# Patient Record
Sex: Male | Born: 1995 | Race: White | Hispanic: No | Marital: Single | Smoking: Current every day smoker
Health system: Southern US, Community
[De-identification: ages and names within clinical notes are randomized; demographics above are authoritative.]

## PROBLEM LIST (undated history)

## (undated) DIAGNOSIS — J45909 Unspecified asthma, uncomplicated: Secondary | ICD-10-CM

## (undated) DIAGNOSIS — B582 Toxoplasma meningoencephalitis: Secondary | ICD-10-CM

## (undated) DIAGNOSIS — B20 Human immunodeficiency virus [HIV] disease: Secondary | ICD-10-CM

## (undated) DIAGNOSIS — Z21 Asymptomatic human immunodeficiency virus [HIV] infection status: Secondary | ICD-10-CM

---
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
## 2016-07-02 ENCOUNTER — Emergency Department
Admission: EM | Admit: 2016-07-02 | Discharge: 2016-07-02 | Disposition: A | Discharge status: Home / Self Care | Payer: Medicaid Other | Attending: Emergency Medicine | Admitting: Emergency Medicine

## 2016-07-02 DIAGNOSIS — R197 Diarrhea, unspecified: Secondary | ICD-10-CM | POA: Diagnosis not present

## 2016-07-02 DIAGNOSIS — R112 Nausea with vomiting, unspecified: Secondary | ICD-10-CM

## 2016-07-02 DIAGNOSIS — J45909 Unspecified asthma, uncomplicated: Secondary | ICD-10-CM | POA: Insufficient documentation

## 2016-07-02 DIAGNOSIS — F17210 Nicotine dependence, cigarettes, uncomplicated: Secondary | ICD-10-CM | POA: Diagnosis not present

## 2016-07-02 DIAGNOSIS — R509 Fever, unspecified: Secondary | ICD-10-CM

## 2016-07-02 LAB — LIPASE, BLOOD: Lipase: 22 U/L (ref 11–51)

## 2016-07-02 LAB — CBC
HCT: 48.8 % (ref 40.0–52.0)
Hemoglobin: 16.8 g/dL (ref 13.0–18.0)
MCH: 31.5 pg (ref 26.0–34.0)
MCHC: 34.4 g/dL (ref 32.0–36.0)
MCV: 91.7 fL (ref 80.0–100.0)
Platelets: 89 10*3/uL — ABNORMAL LOW (ref 150–440)
RBC: 5.32 MIL/uL (ref 4.40–5.90)
RDW: 13.2 % (ref 11.5–14.5)
WBC: 5.4 10*3/uL (ref 3.8–10.6)

## 2016-07-02 LAB — COMPREHENSIVE METABOLIC PANEL
ALT: 24 U/L (ref 17–63)
AST: 28 U/L (ref 15–41)
Albumin: 3.7 g/dL (ref 3.5–5.0)
Alkaline Phosphatase: 51 U/L (ref 38–126)
Anion gap: 8 (ref 5–15)
BUN: 11 mg/dL (ref 6–20)
CO2: 21 mmol/L — ABNORMAL LOW (ref 22–32)
Calcium: 8.2 mg/dL — ABNORMAL LOW (ref 8.9–10.3)
Chloride: 107 mmol/L (ref 101–111)
Creatinine, Ser: 0.95 mg/dL (ref 0.61–1.24)
GFR calc Af Amer: >60 mL/min (ref >60)
GFR calc non Af Amer: >60 mL/min (ref >60)
Glucose, Bld: 109 mg/dL — ABNORMAL HIGH (ref 65–99)
Potassium: 3.4 mmol/L — ABNORMAL LOW (ref 3.5–5.1)
Sodium: 136 mmol/L (ref 135–145)
Total Bilirubin: 0.6 mg/dL (ref 0.3–1.2)
Total Protein: 6.2 g/dL — ABNORMAL LOW (ref 6.5–8.1)

## 2016-07-02 LAB — URINALYSIS, COMPLETE (UACMP) WITH MICROSCOPIC
Bacteria, UA: NONE SEEN
Bilirubin Urine: NEGATIVE
Glucose, UA: NEGATIVE mg/dL
Hgb urine dipstick: NEGATIVE
Ketones, ur: 5 mg/dL — AB
Leukocytes, UA: NEGATIVE
Nitrite: NEGATIVE
Protein, ur: NEGATIVE mg/dL
Specific Gravity, Urine: 1.019 (ref 1.005–1.030)
pH: 5 (ref 5.0–8.0)

## 2016-07-02 LAB — INFLUENZA PANEL BY PCR (TYPE A & B)
Influenza A By PCR: NEGATIVE
Influenza B By PCR: NEGATIVE

## 2016-07-02 MED ORDER — METOCLOPRAMIDE HCL 5 MG/ML IJ SOLN
10.0000 mg | Freq: Once | INTRAMUSCULAR | Status: AC
  Administered 2016-07-02: 10 mg via INTRAVENOUS
  Filled 2016-07-02: qty 2

## 2016-07-02 MED ORDER — ONDANSETRON 4 MG PO TBDP
4.0000 mg | ORAL_TABLET | Freq: Once | ORAL | Status: AC | PRN
  Administered 2016-07-02: 4 mg via ORAL

## 2016-07-02 MED ORDER — DIPHENHYDRAMINE HCL 50 MG/ML IJ SOLN
25.0000 mg | Freq: Once | INTRAMUSCULAR | Status: AC
  Administered 2016-07-02: 25 mg via INTRAVENOUS
  Filled 2016-07-02: qty 1

## 2016-07-02 MED ORDER — SODIUM CHLORIDE 0.9 % IV BOLUS (SEPSIS)
1000.0000 mL | Freq: Once | INTRAVENOUS | Status: AC
  Administered 2016-07-02: 1000 mL via INTRAVENOUS

## 2016-07-02 MED ORDER — KETOROLAC TROMETHAMINE 30 MG/ML IJ SOLN
30.0000 mg | Freq: Once | INTRAMUSCULAR | Status: AC
  Administered 2016-07-02: 30 mg via INTRAVENOUS
  Filled 2016-07-02: qty 1

## 2016-07-02 NOTE — Discharge Instructions (Signed)
[facility]

## 2016-07-02 NOTE — ED Provider Notes (Signed)
[facility] Emergency Department Provider Note   ____________________________________________   First MD Initiated Contact with Patient 07/02/16 [phone]     (approximate)  I have reviewed the triage vital signs and the nursing notes.   HISTORY  Chief Complaint Emesis; Diarrhea; and Migraine    HPI Derrick Leonard is a 21 y.o. male who comes into the hospital today with a temperature to 103. He reports that it was earlier today. He was also vomiting and has a migraine headache. He reports he had a cold a few days ago and lots of diarrhea. The patient has been taking ibuprofen. He vomited 4 times before coming in. He states his dark looking brown and red. The patient's diarrhea has also been watery and brown. The patient endorses some abdominal pain all over his abdomen. He reports it is crampy. The patient has also been feeling weak. He rates his pain at 8 out of 10 in intensity. He denies any sore throat or sick contacts. The patient donated plasma today.   Past Medical History:  Diagnosis Date  . Asthma     Patient Active Problem List   Diagnosis Date Noted  . Appendicitis, acute     Past Surgical History:  Procedure Laterality Date  . LAPAROSCOPIC APPENDECTOMY N/A 06/30/2015   Procedure: APPENDECTOMY LAPAROSCOPIC;  Surgeon: Leafy Ro, MD;  Location: [facility] ORS;  Service: General;  Laterality: N/A;    Prior to Admission medications   Medication Sig Start Date End Date Taking? Authorizing Provider  HYDROcodone-acetaminophen (NORCO/VICODIN) 5-325 MG tablet Take 1-2 tablets by mouth every 4 (four) hours as needed for moderate pain. Patient not taking: Reported on 07/02/2016 07/01/15   Sterling Big F, MD  oxyCODONE-acetaminophen (ROXICET) 5-325 MG tablet Take 1 tablet by mouth every 4 (four) hours as needed for severe pain. Patient not taking: Reported on 07/02/2016 07/05/15   Irean Hong, MD    Allergies Patient has no known allergies.  No family  history on file.  Social History Social History  Substance Use Topics  . Smoking status: Current Every Day Smoker    Packs/day: 1.00    Types: Cigarettes  . Smokeless tobacco: Never Used  . Alcohol use Yes     Comment: occ    Review of Systems  Constitutional: fever/chills Eyes: No visual changes. ENT: No sore throat. Cardiovascular: Denies chest pain. Respiratory: Denies shortness of breath. Gastrointestinal:  abdominal pain.   nausea,  vomiting.   diarrhea.  No constipation. Genitourinary: Negative for dysuria. Musculoskeletal: Negative for back pain. Skin: Negative for rash. Neurological: Headache.   ____________________________________________   PHYSICAL EXAM:  VITAL SIGNS: ED Triage Vitals  Enc Vitals Group     BP 07/02/16 0241 107/61     Pulse Rate 07/02/16 0241 99     Resp 07/02/16 0241 18     Temp 07/02/16 0241 (!) 101 F (38.3 C)     Temp Source 07/02/16 0241 Oral     SpO2 07/02/16 0241 96 %     Weight 07/02/16 0243 130 lb (59 kg)     Height 07/02/16 0243 5\' 9"  (1.753 m)     Head Circumference --      Peak Flow --      Pain Score 07/02/16 0556 10     Pain Loc --      Pain Edu? --      Excl. in GC? --     Constitutional: Alert and oriented. Well appearing and in no  acute distress. Eyes: Conjunctivae are normal. PERRL. EOMI. Head: Atraumatic. Nose: No congestion/rhinnorhea. Mouth/Throat: Mucous membranes are moist.  Oropharynx non-erythematous. Cardiovascular: Normal rate, regular rhythm. Grossly normal heart sounds.  Good peripheral circulation. Respiratory: Normal respiratory effort.  No retractions. Lungs CTAB. Gastrointestinal: Soft and nontender. No distention. Positive bowel sounds Musculoskeletal: No lower extremity tenderness nor edema.   Neurologic:  Normal speech and language.  Skin:  Skin is warm, dry and intact. Psychiatric: Mood and affect are normal.   ____________________________________________   LABS (all labs ordered are  listed, but only abnormal results are displayed)  Labs Reviewed  COMPREHENSIVE METABOLIC PANEL - Abnormal; Notable for the following:       Result Value   Potassium 3.4 (*)    CO2 21 (*)    Glucose, Bld 109 (*)    Calcium 8.2 (*)    Total Protein 6.2 (*)    All other components within normal limits  CBC - Abnormal; Notable for the following:    Platelets 89 (*)    All other components within normal limits  URINALYSIS, COMPLETE (UACMP) WITH MICROSCOPIC - Abnormal; Notable for the following:    Color, Urine YELLOW (*)    APPearance CLOUDY (*)    Ketones, ur 5 (*)    Squamous Epithelial / LPF 0-5 (*)    All other components within normal limits  LIPASE, BLOOD  INFLUENZA PANEL BY PCR (TYPE A & B)   ____________________________________________  EKG  none ____________________________________________  RADIOLOGY  No results found.  ____________________________________________   PROCEDURES  Procedure(s) performed: None  Procedures  Critical Care performed: No  ____________________________________________   INITIAL IMPRESSION / ASSESSMENT AND PLAN / ED COURSE  Pertinent labs & imaging results that were available during my care of the patient were reviewed by me and considered in my medical decision making (see chart for details).  This is a 21 year old male who comes into the hospital today with some fever and abdominal pain. He's also had some body aches and headache as well as some cold symptoms. The patient's blood work is unremarkable. I gave the patient some Reglan, Benadryl and Toradol liter of normal saline. I ordered a flu as there is still flu going around. The patient reports though that he is ready to go home. He reports that he doesn't want to stay anymore he feels improved. He'll be discharged to home.      ____________________________________________   FINAL CLINICAL IMPRESSION(S) / ED DIAGNOSES  Final diagnoses:  Nausea vomiting and diarrhea    Fever, unspecified fever cause      NEW MEDICATIONS STARTED DURING THIS VISIT:  Discharge Medication List as of 07/02/2016  6:56 AM       Note:  This document was prepared using Dragon voice recognition software and may include unintentional dictation errors.    Rebecka ApleyWebster, Derrick Leonard P, MD 07/02/16 [phone]

## 2016-07-02 NOTE — ED Triage Notes (Signed)
Pt reports having N/V/D x1 week.  Pt states he has only been able to keep water down for the past week.  Pt states that this started after donating plasma a week ago.  Pt reports having a fever as high as 103 at home.  Pt reports last dose of ibuprofen was at 4pm yesterday.  Pt A&Ox4 and ambulatory to triage.

## 2016-07-02 NOTE — ED Notes (Addendum)
Pt kicking in bed moaning, when asked by this RN "what was wrong?" pt stated "I wanna go home." Pt educated on leaving AMA, MD notified. Pt verbalized understanding of leaving AMA and reports still wants to go home. IV removed. MD and charge RN notified of patient departure. Pt alert and oriented, ambulated to lobby with steady gait accompanied by friend.

## 2016-07-04 ENCOUNTER — Emergency Department
Admission: EM | Admit: 2016-07-04 | Discharge: 2016-07-05 | Disposition: A | Discharge status: Home / Self Care | Payer: Medicaid Other | Attending: Emergency Medicine | Admitting: Emergency Medicine

## 2016-07-04 DIAGNOSIS — J45909 Unspecified asthma, uncomplicated: Secondary | ICD-10-CM | POA: Diagnosis not present

## 2016-07-04 DIAGNOSIS — J029 Acute pharyngitis, unspecified: Secondary | ICD-10-CM

## 2016-07-04 DIAGNOSIS — F17210 Nicotine dependence, cigarettes, uncomplicated: Secondary | ICD-10-CM | POA: Insufficient documentation

## 2016-07-04 LAB — POCT RAPID STREP A: Streptococcus, Group A Screen (Direct): NEGATIVE

## 2016-07-04 NOTE — ED Triage Notes (Signed)
Patient reports sore throat for several days.

## 2016-07-05 NOTE — ED Notes (Signed)
Pt seen by MD.

## 2016-07-05 NOTE — ED Provider Notes (Signed)
[facility] Emergency Department Provider Note   ____________________________________________    I have reviewed the triage vital signs and the nursing notes.   HISTORY  Chief Complaint Sore Throat     HPI Derrick Leonard is a 21 y.o. male who presents with complaints of mild sore throat. Patient reports his throat has been mildly sore for the last 2 days ever since he stopped vomiting, he apparently had viral gastroenteritis. No fevers or chills. No difficulty swallowing. No difficulty breathing.   Past Medical History:  Diagnosis Date  . Asthma     Patient Active Problem List   Diagnosis Date Noted  . Appendicitis, acute     Past Surgical History:  Procedure Laterality Date  . LAPAROSCOPIC APPENDECTOMY N/A 06/30/2015   Procedure: APPENDECTOMY LAPAROSCOPIC;  Surgeon: Leafy Roiego F Pabon, MD;  Location: [facility] ORS;  Service: General;  Laterality: N/A;    Prior to Admission medications   Medication Sig Start Date End Date Taking? Authorizing Provider  HYDROcodone-acetaminophen (NORCO/VICODIN) 5-325 MG tablet Take 1-2 tablets by mouth every 4 (four) hours as needed for moderate pain. Patient not taking: Reported on 07/02/2016 07/01/15   Sterling BigPabon, Diego F, MD  oxyCODONE-acetaminophen (ROXICET) 5-325 MG tablet Take 1 tablet by mouth every 4 (four) hours as needed for severe pain. Patient not taking: Reported on 07/02/2016 07/05/15   Irean HongSung, Jade J, MD     Allergies Patient has no known allergies.  No family history on file.  Social History Social History  Substance Use Topics  . Smoking status: Current Every Day Smoker    Packs/day: 1.00    Types: Cigarettes  . Smokeless tobacco: Never Used  . Alcohol use Yes     Comment: occ    Review of Systems  Constitutional: No fever/chills  ENT: As above   Gastrointestinal: No abdominal pain.  No nausea, no vomiting.    Skin: Negative for rash. Neurological: Negative for headaches      ____________________________________________   PHYSICAL EXAM:  VITAL SIGNS: ED Triage Vitals [07/04/16 2311]  Enc Vitals Group     BP 116/65     Pulse Rate 76     Resp 20     Temp 98.8 F (37.1 C)     Temp Source Oral     SpO2 99 %     Weight      Height      Head Circumference      Peak Flow      Pain Score      Pain Loc      Pain Edu?      Excl. in GC?     Constitutional: Alert and oriented. No acute distress. Pleasant and interactive Eyes: Conjunctivae are normal.   Nose: No congestion/rhinnorhea. Mouth/Throat: Mucous membranes are moist.  Pharynx normal Cardiovascular: Normal rate, regular rhythm.  Respiratory: Normal respiratory effort.  No retractions. Genitourinary: deferred Musculoskeletal: No lower extremity tenderness nor edema.   Neurologic:  Normal speech and language. No gross focal neurologic deficits are appreciated.   Skin:  Skin is warm, dry and intact. No rash noted.   ____________________________________________   LABS (all labs ordered are listed, but only abnormal results are displayed)  Labs Reviewed  POCT RAPID STREP A   ____________________________________________  EKG   ____________________________________________  RADIOLOGY  None ____________________________________________   PROCEDURES  Procedure(s) performed: No    Critical Care performed: No ____________________________________________   INITIAL IMPRESSION / ASSESSMENT AND PLAN / ED COURSE  Pertinent  labs & imaging results that were available during my care of the patient were reviewed by me and considered in my medical decision making (see chart for details).  Patient well-appearing no acute distress. Exam is normal. Strep is negative. Suspect chemical irritation from vomiting. Recommend supportive care.   ____________________________________________   FINAL CLINICAL IMPRESSION(S) / ED DIAGNOSES  Final diagnoses:  Pharyngitis, unspecified etiology       NEW MEDICATIONS STARTED DURING THIS VISIT:  New Prescriptions   No medications on file     Note:  This document was prepared using Dragon voice recognition software and may include unintentional dictation errors.    Jene Every, MD 07/05/16 [phone]

## 2016-07-08 ENCOUNTER — Emergency Department
Admission: EM | Admit: 2016-07-08 | Discharge: 2016-07-08 | Disposition: A | Discharge status: Home / Self Care | Payer: Medicaid Other | Attending: Emergency Medicine | Admitting: Emergency Medicine

## 2016-07-08 ENCOUNTER — Encounter: Payer: Self-pay | Admitting: Emergency Medicine

## 2016-07-08 DIAGNOSIS — F17210 Nicotine dependence, cigarettes, uncomplicated: Secondary | ICD-10-CM | POA: Diagnosis not present

## 2016-07-08 DIAGNOSIS — R112 Nausea with vomiting, unspecified: Secondary | ICD-10-CM | POA: Insufficient documentation

## 2016-07-08 DIAGNOSIS — R1110 Vomiting, unspecified: Secondary | ICD-10-CM

## 2016-07-08 DIAGNOSIS — E860 Dehydration: Secondary | ICD-10-CM | POA: Diagnosis not present

## 2016-07-08 DIAGNOSIS — J45909 Unspecified asthma, uncomplicated: Secondary | ICD-10-CM | POA: Diagnosis not present

## 2016-07-08 LAB — URINALYSIS, COMPLETE (UACMP) WITH MICROSCOPIC
Bacteria, UA: NONE SEEN
Bilirubin Urine: NEGATIVE
Glucose, UA: NEGATIVE mg/dL
Hgb urine dipstick: NEGATIVE
Ketones, ur: 20 mg/dL — AB
Leukocytes, UA: NEGATIVE
Nitrite: NEGATIVE
Protein, ur: NEGATIVE mg/dL
RBC / HPF: NONE SEEN RBC/hpf (ref 0–5)
Specific Gravity, Urine: 1.028 (ref 1.005–1.030)
WBC, UA: NONE SEEN WBC/hpf (ref 0–5)
pH: 5 (ref 5.0–8.0)

## 2016-07-08 LAB — COMPREHENSIVE METABOLIC PANEL
ALT: 45 U/L (ref 17–63)
AST: 34 U/L (ref 15–41)
Albumin: 4.2 g/dL (ref 3.5–5.0)
Alkaline Phosphatase: 56 U/L (ref 38–126)
Anion gap: 8 (ref 5–15)
BUN: 13 mg/dL (ref 6–20)
CO2: 26 mmol/L (ref 22–32)
Calcium: 8.8 mg/dL — ABNORMAL LOW (ref 8.9–10.3)
Chloride: 102 mmol/L (ref 101–111)
Creatinine, Ser: 0.84 mg/dL (ref 0.61–1.24)
GFR calc Af Amer: >60 mL/min (ref >60)
GFR calc non Af Amer: >60 mL/min (ref >60)
Glucose, Bld: 127 mg/dL — ABNORMAL HIGH (ref 65–99)
Potassium: 3.5 mmol/L (ref 3.5–5.1)
Sodium: 136 mmol/L (ref 135–145)
Total Bilirubin: 0.7 mg/dL (ref 0.3–1.2)
Total Protein: 7.2 g/dL (ref 6.5–8.1)

## 2016-07-08 LAB — CBC
HCT: 50.3 % (ref 40.0–52.0)
Hemoglobin: 17.4 g/dL (ref 13.0–18.0)
MCH: 31 pg (ref 26.0–34.0)
MCHC: 34.7 g/dL (ref 32.0–36.0)
MCV: 89.4 fL (ref 80.0–100.0)
Platelets: 101 10*3/uL — ABNORMAL LOW (ref 150–440)
RBC: 5.62 MIL/uL (ref 4.40–5.90)
RDW: 12.9 % (ref 11.5–14.5)
WBC: 3.7 10*3/uL — ABNORMAL LOW (ref 3.8–10.6)

## 2016-07-08 LAB — URINE DRUG SCREEN, QUALITATIVE (ARMC ONLY)
Amphetamines, Ur Screen: NOT DETECTED
Barbiturates, Ur Screen: NOT DETECTED
Benzodiazepine, Ur Scrn: NOT DETECTED
Cannabinoid 50 Ng, Ur ~~LOC~~: POSITIVE — AB
Cocaine Metabolite,Ur ~~LOC~~: NOT DETECTED
MDMA (Ecstasy)Ur Screen: NOT DETECTED
Methadone Scn, Ur: NOT DETECTED
Opiate, Ur Screen: NOT DETECTED
Phencyclidine (PCP) Ur S: NOT DETECTED
Tricyclic, Ur Screen: NOT DETECTED

## 2016-07-08 LAB — LIPASE, BLOOD: Lipase: 26 U/L (ref 11–51)

## 2016-07-08 MED ORDER — ONDANSETRON 4 MG PO TBDP
8.0000 mg | ORAL_TABLET | Freq: Once | ORAL | Status: AC
  Administered 2016-07-08: 8 mg via ORAL
  Filled 2016-07-08: qty 2

## 2016-07-08 MED ORDER — ONDANSETRON 4 MG PO TBDP: 4.0000 mg | ORAL_TABLET | Freq: Three times a day (TID) | ORAL | 0 refills | Status: DC | PRN

## 2016-07-08 NOTE — ED Triage Notes (Signed)
Pt reports continued vomiting. Pt seen here 07/02/16 and 07/04/16 for vomiting and then sore throat. Pt then was seen by an urgent care that put him on amoxicillin. Pt states he is vomiting 3-4 times a day. Pt denies pain.

## 2016-07-08 NOTE — ED Provider Notes (Addendum)
[facility] Emergency Department Provider Note  ____________________________________________   First MD Initiated Contact with Patient 07/08/16 1716     (approximate)  I have reviewed the triage vital signs and the nursing notes.   HISTORY  Chief Complaint Emesis    HPI Derrick Leonard is a 21 y.o. male who comes to the emergency department with 2 days of nausea vomiting and abdominal discomfort. His symptoms all began 4 days ago when he went to an urgent care and was diagnosed with presumptive strep pharyngitis. He was given a prescription for amoxicillin which she had never had before. The following day he had some nausea and headaches and he came to our emergency department and was evaluated and discharged. At that time he had a negative strep test. He continued to take the amoxicillin until last night. Today he called urgent care and they said that his nausea may be secondary to the amoxicillin did to stop it. He has not been able to eat or keep water down which prompted the visit today. He has remote surgical history of appendectomy.  He has had no fevers no chills and no diarrhea. He has mild to moderate cramping lower abdominal pain that is improved after vomiting.   Past Medical History:  Diagnosis Date  . Asthma     Patient Active Problem List   Diagnosis Date Noted  . Appendicitis, acute     Past Surgical History:  Procedure Laterality Date  . LAPAROSCOPIC APPENDECTOMY N/A 06/30/2015   Procedure: APPENDECTOMY LAPAROSCOPIC;  Surgeon: Leafy Roiego F Pabon, MD;  Location: [facility] ORS;  Service: General;  Laterality: N/A;    Prior to Admission medications   Medication Sig Start Date End Date Taking? Authorizing Provider  HYDROcodone-acetaminophen (NORCO/VICODIN) 5-325 MG tablet Take 1-2 tablets by mouth every 4 (four) hours as needed for moderate pain. Patient not taking: Reported on 07/02/2016 07/01/15   Sterling BigPabon, Diego F, MD  oxyCODONE-acetaminophen  (ROXICET) 5-325 MG tablet Take 1 tablet by mouth every 4 (four) hours as needed for severe pain. Patient not taking: Reported on 07/02/2016 07/05/15   Irean HongSung, Jade J, MD    Allergies Patient has no known allergies.  No family history on file.  Social History Social History  Substance Use Topics  . Smoking status: Current Every Day Smoker    Packs/day: 1.00    Types: Cigarettes  . Smokeless tobacco: Never Used  . Alcohol use Yes     Comment: occ    Review of Systems Constitutional: No fever/chills Eyes: No visual changes. ENT: No sore throat. Cardiovascular: Denies chest pain. Respiratory: Denies shortness of breath. Gastrointestinal: Positive abdominal pain.  Positive nausea, positive vomiting.  No diarrhea.  No constipation. Genitourinary: Negative for dysuria. Musculoskeletal: Negative for back pain. Skin: Negative for rash. Neurological: Negative for headaches, focal weakness or numbness.   ____________________________________________   PHYSICAL EXAM:  VITAL SIGNS: ED Triage Vitals  Enc Vitals Group     BP 07/08/16 1631 132/85     Pulse Rate 07/08/16 1631 79     Resp 07/08/16 1631 20     Temp 07/08/16 1631 98.3 F (36.8 C)     Temp Source 07/08/16 1631 Oral     SpO2 07/08/16 1631 97 %     Weight 07/08/16 1632 135 lb (61.2 kg)     Height 07/08/16 1632 5\' 9"  (1.753 m)     Head Circumference --      Peak Flow --      Pain  Score 07/08/16 1637 0     Pain Loc --      Pain Edu? --      Excl. in GC? --     Constitutional: Alert and oriented 4 lying prone on his abdomen appears somewhat uncomfortable but no diaphoresis Eyes: PERRL EOMI. Head: Atraumatic. Nose: No congestion/rhinnorhea. Mouth/Throat: No trismus uvula midline no pharyngeal erythema or exudate Neck: No stridor.   Cardiovascular: Normal rate, regular rhythm. Grossly normal heart sounds.  Good peripheral circulation. Respiratory: Normal respiratory effort.  No retractions. Lungs CTAB and moving good  air Gastrointestinal: Soft nondistended nontender no rebound or guarding no peritonitis no McBurney's tenderness negative Rovsing's well-healed surgical scars Musculoskeletal: No lower extremity edema   Neurologic:  Normal speech and language. No gross focal neurologic deficits are appreciated. Skin:  Skin is warm, dry and intact. No rash noted. Psychiatric: Mood and affect are normal. Speech and behavior are normal.    _ ____________________________________________   LABS (all labs ordered are listed, but only abnormal results are displayed)  Labs Reviewed  COMPREHENSIVE METABOLIC PANEL - Abnormal; Notable for the following:       Result Value   Glucose, Bld 127 (*)    Calcium 8.8 (*)    All other components within normal limits  CBC - Abnormal; Notable for the following:    WBC 3.7 (*)    Platelets 101 (*)    All other components within normal limits  URINALYSIS, COMPLETE (UACMP) WITH MICROSCOPIC - Abnormal; Notable for the following:    Color, Urine YELLOW (*)    APPearance CLEAR (*)    Ketones, ur 20 (*)    Squamous Epithelial / LPF 0-5 (*)    Non Squamous Epithelial 0-5 (*)    All other components within normal limits  URINE DRUG SCREEN, QUALITATIVE ([facility] ONLY) - Abnormal; Notable for the following:    Cannabinoid 50 Ng, Ur [place] POSITIVE (*)    All other components within normal limits  LIPASE, BLOOD    Significant hemoconcentration suggestive of dehydration ketones in his urine also suggestive of dehydration __________________________________________  EKG   ____________________________________________  RADIOLOGY   ____________________________________________   PROCEDURES  Procedure(s) performed: no  Procedures  Critical Care performed: no  Observation: no ____________________________________________   INITIAL IMPRESSION / ASSESSMENT AND PLAN / ED COURSE  Pertinent labs & imaging results that were available during my care of the patient were  reviewed by me and considered in my medical decision making (see chart for details).  By the time I saw the patient he was already drinking ginger ale. He has a benign abdomen and artery has an appendectomy. He is somewhat hemoconcentrated likely secondary to dehydration. It is not clear whether or not his symptoms are related to amoxicillin versus viral syndrome. Regardless she is strep negative and does not need to continue antibiotics. I will discharge him home with a short course of Zofran and strict return precautions.      ____________________________________________   FINAL CLINICAL IMPRESSION(S) / ED DIAGNOSES  Final diagnoses:  Vomiting in pediatric patient  Dehydration      NEW MEDICATIONS STARTED DURING THIS VISIT:  New Prescriptions   No medications on file     Note:  This document was prepared using Dragon voice recognition software and may include unintentional dictation errors.     Merrily Brittle, MD 07/08/16 1753    Merrily Brittle, MD 07/08/16 [phone]

## 2016-07-08 NOTE — ED Notes (Signed)
Pt ambulatory to room with steady gait, emesis bag and ginger ale in hand. Here for continued vomiting. Alert and oriented.

## 2016-07-08 NOTE — Discharge Instructions (Signed)
Please make sure you remain well-hydrated and use your Zofran up to 3 times a day as needed for nausea. Return to the emergency department for any concerns.  It was a pleasure to take care of you today, and thank you for coming to our emergency department.  If you have any questions or concerns before leaving please ask the nurse to grab me and I'm more than happy to go through your aftercare instructions again.  If you were prescribed any opioid pain medication today such as Norco, Vicodin, Percocet, morphine, hydrocodone, or oxycodone please make sure you do not drive when you are taking this medication as it can alter your ability to drive safely.  If you have any concerns once you are home that you are not improving or are in fact getting worse before you can make it to your follow-up appointment, please do not hesitate to call 911 and come back for further evaluation.  Merrily BrittleNeil Delainey Winstanley MD  Results for orders placed or performed during the hospital encounter of 07/08/16  Lipase, blood  Result Value Ref Range   Lipase 26 11 - 51 U/L  Comprehensive metabolic panel  Result Value Ref Range   Sodium 136 135 - 145 mmol/L   Potassium 3.5 3.5 - 5.1 mmol/L   Chloride 102 101 - 111 mmol/L   CO2 26 22 - 32 mmol/L   Glucose, Bld 127 (H) 65 - 99 mg/dL   BUN 13 6 - 20 mg/dL   Creatinine, Ser 6.570.84 0.61 - 1.24 mg/dL   Calcium 8.8 (L) 8.9 - 10.3 mg/dL   Total Protein 7.2 6.5 - 8.1 g/dL   Albumin 4.2 3.5 - 5.0 g/dL   AST 34 15 - 41 U/L   ALT 45 17 - 63 U/L   Alkaline Phosphatase 56 38 - 126 U/L   Total Bilirubin 0.7 0.3 - 1.2 mg/dL   GFR calc non Af Amer >60 >60 mL/min   GFR calc Af Amer >60 >60 mL/min   Anion gap 8 5 - 15  CBC  Result Value Ref Range   WBC 3.7 (L) 3.8 - 10.6 K/uL   RBC 5.62 4.40 - 5.90 MIL/uL   Hemoglobin 17.4 13.0 - 18.0 g/dL   HCT 84.650.3 96.240.0 - 95.252.0 %   MCV 89.4 80.0 - 100.0 fL   MCH 31.0 26.0 - 34.0 pg   MCHC 34.7 32.0 - 36.0 g/dL   RDW 84.112.9 32.411.5 - 40.114.5 %   Platelets 101  (L) 150 - 440 K/uL  Urinalysis, Complete w Microscopic  Result Value Ref Range   Color, Urine YELLOW (A) YELLOW   APPearance CLEAR (A) CLEAR   Specific Gravity, Urine 1.028 1.005 - 1.030   pH 5.0 5.0 - 8.0   Glucose, UA NEGATIVE NEGATIVE mg/dL   Hgb urine dipstick NEGATIVE NEGATIVE   Bilirubin Urine NEGATIVE NEGATIVE   Ketones, ur 20 (A) NEGATIVE mg/dL   Protein, ur NEGATIVE NEGATIVE mg/dL   Nitrite NEGATIVE NEGATIVE   Leukocytes, UA NEGATIVE NEGATIVE   RBC / HPF NONE SEEN 0 - 5 RBC/hpf   WBC, UA NONE SEEN 0 - 5 WBC/hpf   Bacteria, UA NONE SEEN NONE SEEN   Squamous Epithelial / LPF 0-5 (A) NONE SEEN   Mucous PRESENT    Non Squamous Epithelial 0-5 (A) NONE SEEN  Urine Drug Screen, Qualitative  Result Value Ref Range   Tricyclic, Ur Screen NONE DETECTED NONE DETECTED   Amphetamines, Ur Screen NONE DETECTED NONE DETECTED   MDMA (  Ecstasy)Ur Screen NONE DETECTED NONE DETECTED   Cocaine Metabolite,Ur [place] NONE DETECTED NONE DETECTED   Opiate, Ur Screen NONE DETECTED NONE DETECTED   Phencyclidine (PCP) Ur S NONE DETECTED NONE DETECTED   Cannabinoid 50 Ng, Ur [place] POSITIVE (A) NONE DETECTED   Barbiturates, Ur Screen NONE DETECTED NONE DETECTED   Benzodiazepine, Ur Scrn NONE DETECTED NONE DETECTED   Methadone Scn, Ur NONE DETECTED NONE DETECTED

## 2016-07-12 ENCOUNTER — Encounter: Payer: Self-pay | Admitting: Emergency Medicine

## 2016-07-12 ENCOUNTER — Emergency Department
Admission: EM | Admit: 2016-07-12 | Discharge: 2016-07-13 | Disposition: A | Discharge status: Home / Self Care | Payer: Medicaid Other | Attending: Emergency Medicine | Admitting: Emergency Medicine

## 2016-07-12 DIAGNOSIS — J45909 Unspecified asthma, uncomplicated: Secondary | ICD-10-CM | POA: Diagnosis not present

## 2016-07-12 DIAGNOSIS — Y828 Other medical devices associated with adverse incidents: Secondary | ICD-10-CM | POA: Insufficient documentation

## 2016-07-12 DIAGNOSIS — F17210 Nicotine dependence, cigarettes, uncomplicated: Secondary | ICD-10-CM | POA: Diagnosis not present

## 2016-07-12 DIAGNOSIS — Z880 Allergy status to penicillin: Secondary | ICD-10-CM | POA: Diagnosis not present

## 2016-07-12 DIAGNOSIS — T887XXA Unspecified adverse effect of drug or medicament, initial encounter: Secondary | ICD-10-CM | POA: Diagnosis not present

## 2016-07-12 DIAGNOSIS — T360X5A Adverse effect of penicillins, initial encounter: Secondary | ICD-10-CM | POA: Diagnosis not present

## 2016-07-12 DIAGNOSIS — R21 Rash and other nonspecific skin eruption: Secondary | ICD-10-CM | POA: Diagnosis present

## 2016-07-12 DIAGNOSIS — T7840XA Allergy, unspecified, initial encounter: Secondary | ICD-10-CM

## 2016-07-12 LAB — MONONUCLEOSIS SCREEN: Mono Screen: NEGATIVE

## 2016-07-12 MED ORDER — PREDNISONE 50 MG PO TABS: 50.0000 mg | ORAL_TABLET | Freq: Every day | ORAL | 0 refills | Status: DC

## 2016-07-12 MED ORDER — DIPHENHYDRAMINE HCL 25 MG PO CAPS
50.0000 mg | ORAL_CAPSULE | Freq: Once | ORAL | Status: AC
  Administered 2016-07-12: 50 mg via ORAL
  Filled 2016-07-12: qty 2

## 2016-07-12 MED ORDER — SODIUM CHLORIDE 0.9 % IV BOLUS (SEPSIS)
1000.0000 mL | Freq: Once | INTRAVENOUS | Status: AC
  Administered 2016-07-12: 1000 mL via INTRAVENOUS

## 2016-07-12 MED ORDER — METHYLPREDNISOLONE SODIUM SUCC 125 MG IJ SOLR
125.0000 mg | Freq: Once | INTRAMUSCULAR | Status: AC
  Administered 2016-07-12: 125 mg via INTRAVENOUS
  Filled 2016-07-12: qty 2

## 2016-07-12 MED ORDER — DIPHENHYDRAMINE HCL 50 MG/ML IJ SOLN
50.0000 mg | Freq: Once | INTRAMUSCULAR | Status: AC
  Administered 2016-07-12: 50 mg via INTRAVENOUS
  Filled 2016-07-12: qty 1

## 2016-07-12 MED ORDER — FAMOTIDINE 20 MG PO TABS: 20.0000 mg | ORAL_TABLET | Freq: Every day | ORAL | 0 refills | Status: DC

## 2016-07-12 MED ORDER — FAMOTIDINE IN NACL 20-0.9 MG/50ML-% IV SOLN
20.0000 mg | Freq: Once | INTRAVENOUS | Status: AC
  Administered 2016-07-12: 20 mg via INTRAVENOUS
  Filled 2016-07-12: qty 50

## 2016-07-12 NOTE — ED Triage Notes (Addendum)
Pt ambulatory to triage with steady gait, no distress noted. Pt reports he has been taking amoxicillin since the 17th and this AM he noticed a rash all over body and itching. Pt denies taking OTC meds at home. Pt denies sore throat and SOB.

## 2016-07-12 NOTE — ED Provider Notes (Signed)
[facility] Emergency Department Provider Note  ____________________________________________  Time seen: Approximately 10:41 PM  I have reviewed the triage vital signs and the nursing notes.   HISTORY  Chief Complaint Allergic Reaction    HPI Derrick Leonard is a 21 y.o. male who presents emergency department complaining of allergic reaction from amoxicillin. Patient has had several significant medical events over the past 2 weeks. Patient was diagnosed with appendicitis with appendectomy 13 days prior to arrival. Patient then had postop complications to include nausea and vomiting, sore throat. Patient was evaluated by urgent care and diagnosed with strep throat approximately 10 days prior to today. Patient reports that he was wrapping up his amoxicillin when he developed a systemic rash. Patient denies any visual changes, angioedema, wheezing, stridor, difficulty breathing. Patient reports that rash initially was very pruritic in nature and is now more of a pruritic versus burning sensation. Patient reports that sensation to her better with scratching. Patient did not take evening dose of amoxicillin. He denies any other medications at this time. No new foods, medications, soaps, detergents. Patient reports only new medications are Vicodin, Percocet, amoxicillin. Patient reports that only medication he took today was amoxicillin. Patient denies any history of mono and states that urgent care did not test for mono.   Past Medical History:  Diagnosis Date  . Asthma     Patient Active Problem List   Diagnosis Date Noted  . Appendicitis, acute     Past Surgical History:  Procedure Laterality Date  . LAPAROSCOPIC APPENDECTOMY N/A 06/30/2015   Procedure: APPENDECTOMY LAPAROSCOPIC;  Surgeon: Leafy Ro, MD;  Location: [facility] ORS;  Service: General;  Laterality: N/A;    Prior to Admission medications   Medication Sig Start Date End Date Taking? Authorizing  Provider  famotidine (PEPCID) 20 MG tablet Take 1 tablet (20 mg total) by mouth daily. 07/12/16 07/12/17  Cuthriell, Delorise Royals, PA-C  HYDROcodone-acetaminophen (NORCO/VICODIN) 5-325 MG tablet Take 1-2 tablets by mouth every 4 (four) hours as needed for moderate pain. Patient not taking: Reported on 07/02/2016 07/01/15   Leafy Ro, MD  ondansetron (ZOFRAN ODT) 4 MG disintegrating tablet Take 1 tablet (4 mg total) by mouth every 8 (eight) hours as needed for nausea or vomiting. 07/08/16   Merrily Brittle, MD  oxyCODONE-acetaminophen (ROXICET) 5-325 MG tablet Take 1 tablet by mouth every 4 (four) hours as needed for severe pain. Patient not taking: Reported on 07/02/2016 07/05/15   Irean Hong, MD  predniSONE (DELTASONE) 50 MG tablet Take 1 tablet (50 mg total) by mouth daily with breakfast. 07/12/16   Cuthriell, Delorise Royals, PA-C    Allergies Patient has no known allergies.  History reviewed. No pertinent family history.  Social History Social History  Substance Use Topics  . Smoking status: Current Every Day Smoker    Packs/day: 1.00    Types: Cigarettes  . Smokeless tobacco: Never Used  . Alcohol use Yes     Comment: occ     Review of Systems  Constitutional: No fever/chills Eyes: No visual changes.  ENT: No upper respiratory complaints. Cardiovascular: no chest pain. Respiratory: no cough. No SOB. Gastrointestinal: No abdominal pain.  No nausea, no vomiting.  No diarrhea.  No constipation. Musculoskeletal: Negative for musculoskeletal pain. Skin: Positive for generalized rash to the face, torso, extremities. Neurological: Negative for headaches, focal weakness or numbness. 10-point ROS otherwise negative.  ____________________________________________   PHYSICAL EXAM:  VITAL SIGNS: ED Triage Vitals  Enc Vitals Group  BP 07/12/16 2143 130/69     Pulse Rate 07/12/16 2143 84     Resp 07/12/16 2143 17     Temp 07/12/16 2143 98 F (36.7 C)     Temp Source 07/12/16 2143  Oral     SpO2 07/12/16 2143 98 %     Weight 07/12/16 2139 135 lb (61.2 kg)     Height --      Head Circumference --      Peak Flow --      Pain Score 07/12/16 2238 0     Pain Loc --      Pain Edu? --      Excl. in GC? --      Constitutional: Alert and oriented. Well appearing and in no acute distress. Eyes: Conjunctivae are normal. PERRL. EOMI. Head: Atraumatic. ENT:      Ears:       Nose: No congestion/rhinnorhea.      Mouth/Throat: Mucous membranes are moist. Oropharynx is nonerythematous and nonedematous. No visual signs of angioedema. Uvula is midline. Tonsils are mildly erythematous and mildly enlarged. No exudates. Neck: No stridor. Neck is supple with full range of motion Hematological/Lymphatic/Immunilogical: No cervical lymphadenopathy. Cardiovascular: Normal rate, regular rhythm. Normal S1 and S2.  Good peripheral circulation. Respiratory: Normal respiratory effort without tachypnea or retractions. Lungs CTAB. Good air entry to the bases with no decreased or absent breath sounds. Musculoskeletal: Full range of motion to all extremities. No gross deformities appreciated. Neurologic:  Normal speech and language. No gross focal neurologic deficits are appreciated.  Skin:  Skin is warm, dry and intact. Diffuse urticaria and wheals noted to face, torso, extremities. Wheals are nonpainful to touch. No vesicles. Skin intact. Psychiatric: Mood and affect are normal. Speech and behavior are normal. Patient exhibits appropriate insight and judgement.   ____________________________________________   LABS (all labs ordered are listed, but only abnormal results are displayed)  Labs Reviewed  MONONUCLEOSIS SCREEN   ____________________________________________  EKG   ____________________________________________  RADIOLOGY   No results found.  ____________________________________________    PROCEDURES  Procedure(s) performed:    Procedures    Medications   diphenhydrAMINE (BENADRYL) capsule 50 mg (50 mg Oral Given 07/12/16 2145)  sodium chloride 0.9 % bolus 1,000 mL (1,000 mLs Intravenous New Bag/Given 07/12/16 2307)  famotidine (PEPCID) IVPB 20 mg premix (20 mg Intravenous New Bag/Given 07/12/16 2256)  methylPREDNISolone sodium succinate (SOLU-MEDROL) 125 mg/2 mL injection 125 mg (125 mg Intravenous Given 07/12/16 2256)  diphenhydrAMINE (BENADRYL) injection 50 mg (50 mg Intravenous Given 07/12/16 2256)     ____________________________________________   INITIAL IMPRESSION / ASSESSMENT AND PLAN / ED COURSE  Pertinent labs & imaging results that were available during my care of the patient were reviewed by me and considered in my medical decision making (see chart for details).  Review of the [place] CSRS was performed in accordance of the [facility] prior to dispensing any controlled drugs.     Patient's diagnosis is consistent with Allergic reaction. This reaction is likely due to amoxicillin. There was some concern that patient may have adverse drug reaction as a result of mono, however Patient was evaluated for mono which returns negative at this time. Patient was given Benadryl, steroids, famotidine and emergency department which helped symptoms. No angioedema or concern for oropharyngeal or airway involvement.. Patient will be discharged home with prescriptions for continue prednisone and famotidine. Patient is to take 50 mg of Benadryl every 4-6 hours.. Patient is to follow up with primary care as  needed or otherwise directed. Patient is given ED precautions to return to the ED for any worsening or new symptoms.     ____________________________________________  FINAL CLINICAL IMPRESSION(S) / ED DIAGNOSES  Final diagnoses:  Allergic reaction to drug, initial encounter      NEW MEDICATIONS STARTED DURING THIS VISIT:  New Prescriptions   FAMOTIDINE (PEPCID) 20 MG TABLET    Take 1 tablet (20 mg total) by mouth daily.   PREDNISONE (DELTASONE)  50 MG TABLET    Take 1 tablet (50 mg total) by mouth daily with breakfast.        This chart was dictated using voice recognition software/Dragon. Despite best efforts to proofread, errors can occur which can change the meaning. Any change was purely unintentional.    Racheal PatchesCuthriell, Jonathan D, PA-C 07/12/16 2353    Phineas SemenGoodman, [place], MD 07/13/16 [phone]

## 2016-07-13 ENCOUNTER — Emergency Department
Admission: EM | Admit: 2016-07-13 | Discharge: 2016-07-13 | Disposition: A | Discharge status: Home / Self Care | Payer: Medicaid Other

## 2016-07-15 ENCOUNTER — Emergency Department
Admission: EM | Admit: 2016-07-15 | Discharge: 2016-07-15 | Disposition: A | Discharge status: Home / Self Care | Payer: Medicaid Other | Attending: Emergency Medicine | Admitting: Emergency Medicine

## 2016-07-15 DIAGNOSIS — J45909 Unspecified asthma, uncomplicated: Secondary | ICD-10-CM | POA: Diagnosis not present

## 2016-07-15 DIAGNOSIS — L270 Generalized skin eruption due to drugs and medicaments taken internally: Secondary | ICD-10-CM | POA: Insufficient documentation

## 2016-07-15 DIAGNOSIS — F17210 Nicotine dependence, cigarettes, uncomplicated: Secondary | ICD-10-CM | POA: Diagnosis not present

## 2016-07-15 DIAGNOSIS — R21 Rash and other nonspecific skin eruption: Secondary | ICD-10-CM | POA: Diagnosis present

## 2016-07-15 DIAGNOSIS — T360X5A Adverse effect of penicillins, initial encounter: Secondary | ICD-10-CM

## 2016-07-15 LAB — COMPREHENSIVE METABOLIC PANEL
ALT: 54 U/L (ref 17–63)
AST: 44 U/L — ABNORMAL HIGH (ref 15–41)
Albumin: 4.1 g/dL (ref 3.5–5.0)
Alkaline Phosphatase: 62 U/L (ref 38–126)
Anion gap: 6 (ref 5–15)
BUN: 17 mg/dL (ref 6–20)
CO2: 28 mmol/L (ref 22–32)
Calcium: 8.9 mg/dL (ref 8.9–10.3)
Chloride: 105 mmol/L (ref 101–111)
Creatinine, Ser: 0.99 mg/dL (ref 0.61–1.24)
GFR calc Af Amer: >60 mL/min (ref >60)
GFR calc non Af Amer: >60 mL/min (ref >60)
Glucose, Bld: 108 mg/dL — ABNORMAL HIGH (ref 65–99)
Potassium: 3.4 mmol/L — ABNORMAL LOW (ref 3.5–5.1)
Sodium: 139 mmol/L (ref 135–145)
Total Bilirubin: 0.5 mg/dL (ref 0.3–1.2)
Total Protein: 6.4 g/dL — ABNORMAL LOW (ref 6.5–8.1)

## 2016-07-15 LAB — URINE DRUG SCREEN, QUALITATIVE (ARMC ONLY)
Amphetamines, Ur Screen: NOT DETECTED
Barbiturates, Ur Screen: NOT DETECTED
Benzodiazepine, Ur Scrn: NOT DETECTED
Cannabinoid 50 Ng, Ur ~~LOC~~: POSITIVE — AB
Cocaine Metabolite,Ur ~~LOC~~: NOT DETECTED
MDMA (Ecstasy)Ur Screen: NOT DETECTED
Methadone Scn, Ur: NOT DETECTED
Opiate, Ur Screen: NOT DETECTED
Phencyclidine (PCP) Ur S: NOT DETECTED
Tricyclic, Ur Screen: NOT DETECTED

## 2016-07-15 LAB — CBC
HCT: 45.4 % (ref 40.0–52.0)
Hemoglobin: 15.9 g/dL (ref 13.0–18.0)
MCH: 31.1 pg (ref 26.0–34.0)
MCHC: 35 g/dL (ref 32.0–36.0)
MCV: 88.8 fL (ref 80.0–100.0)
Platelets: 129 10*3/uL — ABNORMAL LOW (ref 150–440)
RBC: 5.11 MIL/uL (ref 4.40–5.90)
RDW: 13.2 % (ref 11.5–14.5)
WBC: 6.6 10*3/uL (ref 3.8–10.6)

## 2016-07-15 LAB — ETHANOL: Alcohol, Ethyl (B): <5 mg/dL (ref <5)

## 2016-07-15 MED ORDER — DIPHENHYDRAMINE HCL 50 MG/ML IJ SOLN
25.0000 mg | Freq: Once | INTRAMUSCULAR | Status: AC
  Administered 2016-07-15: 25 mg via INTRAVENOUS
  Filled 2016-07-15: qty 1

## 2016-07-15 MED ORDER — DIPHENHYDRAMINE HCL 25 MG PO CAPS: 50.0000 mg | ORAL_CAPSULE | ORAL | 0 refills | Status: DC | PRN

## 2016-07-15 NOTE — ED Triage Notes (Signed)
He arrives via [facility] from home with reports of a rash since last week when he began taking amoxicillin - he also reports that he found out 2 days ago that he is HIV positive and that the is scared and depressed from his recent diagnosis   He denies suicidal ideation  Reports no sleep in the last five nights

## 2016-07-15 NOTE — ED Provider Notes (Signed)
[facility] Emergency Department Provider Note  ____________________________________________  Time seen: Approximately 11:59 AM  I have reviewed the triage vital signs and the nursing notes.   HISTORY  Chief Complaint Medication Reaction; HIV Positive/AIDS; and Depression   HPI Derrick Leonard is a 21 y.o. male with a history of asthma and recently diagnosed HIV infection who presents for evaluation of a reaction to medication. Patient was seen hereon 6/17 and diagnosed with pharyngitis. His strep was negative. Patient was sent home on amoxicillin. He then developed a rash, vomiting and nausea which prompted his return to the emergency room on 6/21 and again 6/25 where mono was checked and also negative. Patient was sent home on prednisone which he took for 3 days and has had difficulty sleeping, mood swings, has been very depressed and crying. Patient has also recently been diagnosed with HIV and is still coping with that diagnosis. He has an appointment with infectious disease on July 12. He continues to endorse diffuse pruritic rash which is improving. He denies difficulty swallowing, angioedema, difficulty breathing, vomiting, diarrhea. Patient denies suicidal or homicidal ideation. No tick bites.  Past Medical History:  Diagnosis Date  . Asthma     Patient Active Problem List   Diagnosis Date Noted  . Appendicitis, acute     Past Surgical History:  Procedure Laterality Date  . LAPAROSCOPIC APPENDECTOMY N/A 06/30/2015   Procedure: APPENDECTOMY LAPAROSCOPIC;  Surgeon: Leafy Ro, MD;  Location: [facility] ORS;  Service: General;  Laterality: N/A;    Prior to Admission medications   Medication Sig Start Date End Date Taking? Authorizing Provider  famotidine (PEPCID) 20 MG tablet Take 1 tablet (20 mg total) by mouth daily. 07/12/16 07/12/17 Yes Cuthriell, Delorise Royals, PA-C  ondansetron (ZOFRAN ODT) 4 MG disintegrating tablet Take 1 tablet (4 mg total) by  mouth every 8 (eight) hours as needed for nausea or vomiting. 07/08/16  Yes Merrily Brittle, MD  predniSONE (DELTASONE) 50 MG tablet Take 1 tablet (50 mg total) by mouth daily with breakfast. 07/12/16  Yes Cuthriell, Delorise Royals, PA-C  diphenhydrAMINE (BENADRYL) 25 mg capsule Take 2 capsules (50 mg total) by mouth every 4 (four) hours as needed. 07/15/16 07/15/17  Nita Sickle, MD  HYDROcodone-acetaminophen (NORCO/VICODIN) 5-325 MG tablet Take 1-2 tablets by mouth every 4 (four) hours as needed for moderate pain. Patient not taking: Reported on 07/02/2016 07/01/15   Sterling Big F, MD  oxyCODONE-acetaminophen (ROXICET) 5-325 MG tablet Take 1 tablet by mouth every 4 (four) hours as needed for severe pain. Patient not taking: Reported on 07/02/2016 07/05/15   Irean Hong, MD    Allergies Amoxicillin  No family history on file.  Social History Social History  Substance Use Topics  . Smoking status: Current Every Day Smoker    Packs/day: 1.00    Types: Cigarettes  . Smokeless tobacco: Never Used  . Alcohol use Yes     Comment: occ    Review of Systems  Constitutional: Negative for fever. Eyes: Negative for visual changes. ENT: Negative for sore throat. Neck: No neck pain  Cardiovascular: Negative for chest pain. Respiratory: Negative for shortness of breath. Gastrointestinal: Negative for abdominal pain, vomiting or diarrhea. Genitourinary: Negative for dysuria. Musculoskeletal: Negative for back pain. Skin: + rash. Neurological: Negative for headaches, weakness or numbness. Psych: No SI or HI. + depression, insomnia  ____________________________________________   PHYSICAL EXAM:  VITAL SIGNS: ED Triage Vitals  Enc Vitals Group     BP 07/15/16 1032 (!)  147/90     Pulse Rate 07/15/16 1032 82     Resp 07/15/16 1032 18     Temp 07/15/16 1032 98.4 F (36.9 C)     Temp Source 07/15/16 1032 Oral     SpO2 07/15/16 1032 97 %     Weight 07/15/16 1033 135 lb (61.2 kg)      Height 07/15/16 1033 5\' 9"  (1.753 m)     Head Circumference --      Peak Flow --      Pain Score 07/15/16 1031 10     Pain Loc --      Pain Edu? --      Excl. in GC? --     Constitutional: Alert and oriented. Well appearing and in no apparent distress. HEENT:      Head: Normocephalic and atraumatic.         Eyes: Conjunctivae are normal. Sclera is non-icteric.       Mouth/Throat: Mucous membranes are moist.       Neck: Supple with no signs of meningismus. Cardiovascular: Regular rate and rhythm. No murmurs, gallops, or rubs. 2+ symmetrical distal pulses are present in all extremities. No JVD. Respiratory: Normal respiratory effort. Lungs are clear to auscultation bilaterally. No wheezes, crackles, or rhonchi.  Gastrointestinal: Soft, non tender, and non distended with positive bowel sounds. No rebound or guarding. Musculoskeletal: Nontender with normal range of motion in all extremities. No edema, cyanosis, or erythema of extremities. Neurologic: Normal speech and language. Face is symmetric. Moving all extremities. No gross focal neurologic deficits are appreciated. Skin: Diffuse erythematous rash Psychiatric: Mood and affect are normal. Speech and behavior are normal.  ____________________________________________   LABS (all labs ordered are listed, but only abnormal results are displayed)  Labs Reviewed  COMPREHENSIVE METABOLIC PANEL - Abnormal; Notable for the following:       Result Value   Potassium 3.4 (*)    Glucose, Bld 108 (*)    Total Protein 6.4 (*)    AST 44 (*)    All other components within normal limits  CBC - Abnormal; Notable for the following:    Platelets 129 (*)    All other components within normal limits  ETHANOL  URINE DRUG SCREEN, QUALITATIVE ([facility] ONLY)   ____________________________________________  EKG  none  ____________________________________________  RADIOLOGY  none   ____________________________________________   PROCEDURES  Procedure(s) performed: None Procedures Critical Care performed:  None ____________________________________________   INITIAL IMPRESSION / ASSESSMENT AND PLAN / ED COURSE  21 y.o. male with a history of asthma and recently diagnosed HIV infection who presents for evaluation of a reaction to medication. Patient with mood swings and difficulty sleeping since being started on prednisone for an allergic reaction to amoxicillin. We'll discontinue it at this time. Patient has no suicidal or homicidal ideation. Patient is also coping with new diagnosis of HIV and has an appointment on 7/12 with infectious disease. Has not been started on antiretrovirals yet. Patient has a pruritic erythematous diffuse rash consistent with allergic reaction versus a viral exanthem. No involvement of mucous membranes. No fever. No tick bites. Patient is well appearing otherwise with no evidence of anaphylaxis. Will give benadryl and dc home on famotidine and benadryl.      Pertinent labs & imaging results that were available during my care of the patient were reviewed by me and considered in my medical decision making (see chart for details).    ____________________________________________   FINAL CLINICAL IMPRESSION(S) / ED DIAGNOSES  Final diagnoses:  Amoxicillin-induced allergic rash      NEW MEDICATIONS STARTED DURING THIS VISIT:  New Prescriptions   DIPHENHYDRAMINE (BENADRYL) 25 MG CAPSULE    Take 2 capsules (50 mg total) by mouth every 4 (four) hours as needed.     Note:  This document was prepared using Dragon voice recognition software and may include unintentional dictation errors.    Don Perking, [place], MD 07/15/16 [phone]

## 2016-07-15 NOTE — ED Notes (Signed)
BEHAVIORAL HEALTH ROUNDING Patient sleeping: No. Patient alert and oriented: yes Behavior appropriate: Yes.  ; If no, describe:  Nutrition and fluids offered: yes Toileting and hygiene offered: Yes  Sitter present: q15 minute observations and security  monitoring Law enforcement present: Yes  ODS  

## 2016-07-15 NOTE — ED Notes (Signed)

## 2017-04-25 ENCOUNTER — Encounter: Payer: Self-pay | Admitting: Emergency Medicine

## 2017-04-25 ENCOUNTER — Other Ambulatory Visit: Payer: Self-pay

## 2017-04-25 ENCOUNTER — Emergency Department
Admission: EM | Admit: 2017-04-25 | Discharge: 2017-04-25 | Disposition: A | Discharge status: Home / Self Care | Payer: Self-pay | Attending: Emergency Medicine | Admitting: Emergency Medicine

## 2017-04-25 ENCOUNTER — Emergency Department: Payer: Self-pay

## 2017-04-25 DIAGNOSIS — R0789 Other chest pain: Secondary | ICD-10-CM | POA: Insufficient documentation

## 2017-04-25 DIAGNOSIS — Y998 Other external cause status: Secondary | ICD-10-CM | POA: Insufficient documentation

## 2017-04-25 DIAGNOSIS — Y939 Activity, unspecified: Secondary | ICD-10-CM | POA: Insufficient documentation

## 2017-04-25 DIAGNOSIS — M79672 Pain in left foot: Secondary | ICD-10-CM | POA: Insufficient documentation

## 2017-04-25 DIAGNOSIS — M545 Low back pain: Secondary | ICD-10-CM | POA: Insufficient documentation

## 2017-04-25 DIAGNOSIS — Y92410 Unspecified street and highway as the place of occurrence of the external cause: Secondary | ICD-10-CM | POA: Insufficient documentation

## 2017-04-25 NOTE — ED Provider Notes (Cosign Needed)
X-ray orders were placed but patient left before he was evaluated by a provider.    Pia MauWoods, Jaclyn [place], [place]-C 04/25/17 1626

## 2017-04-25 NOTE — ED Notes (Signed)
Not in room for x-ray results  Provider aware
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
## 2017-07-28 IMAGING — CT CT ABD-PELV W/ CM
2 of 4 series · 15 of 46 positions shown, 17 images · IV contrast (iopamidol)
Comparison: None.

CLINICAL DATA: Left-sided abdominal pain since early this morning
with nausea vomiting.

EXAM:
CT ABDOMEN AND PELVIS WITH CONTRAST
TECHNIQUE: Multidetector CT imaging of the abdomen and pelvis was performed
using the standard protocol following bolus administration of
intravenous contrast.
CONTRAST:  100mL R1C81Y-1NN IOPAMIDOL (R1C81Y-1NN) INJECTION 61%

[Series 2: routine abd pel with · axial · 0.63mm/px · z∈[-493,-48]mm · 12 of 99 slices shown, 14 images]
[im 5/99  soft-tissue]
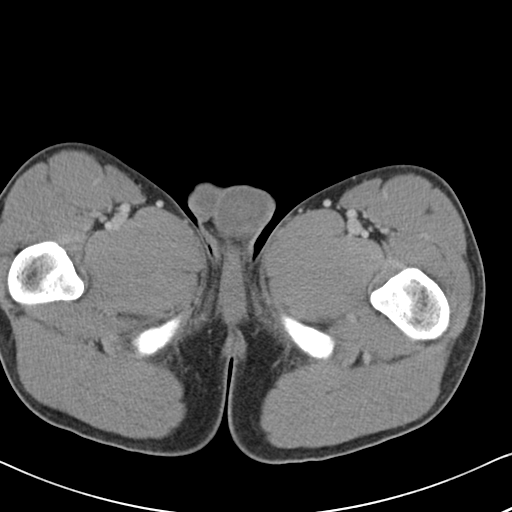
[im 5/99  bone]
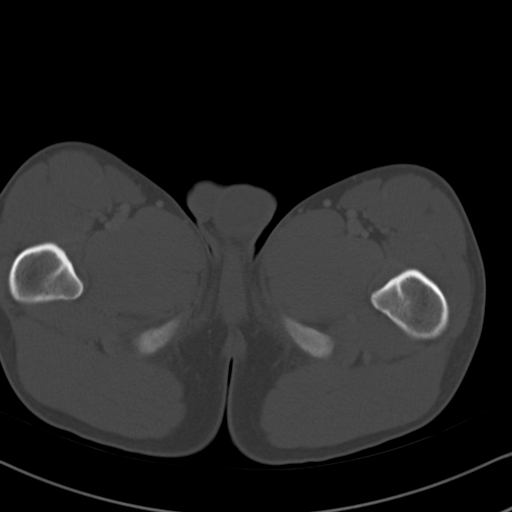
[im 13/99  soft-tissue]
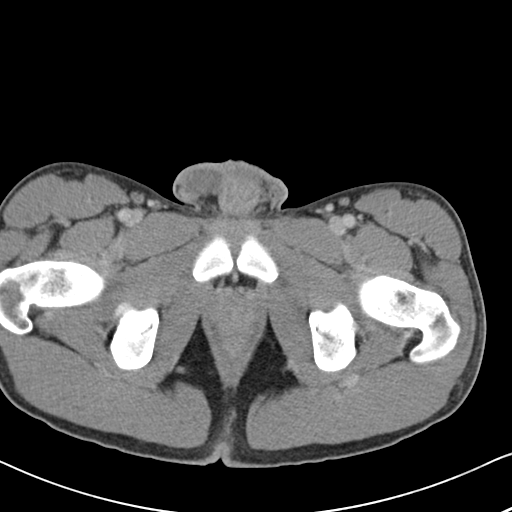
[im 22/99  soft-tissue]
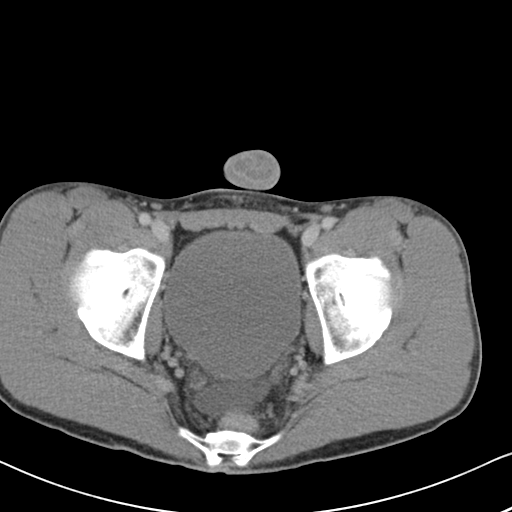
[im 30/99  soft-tissue]
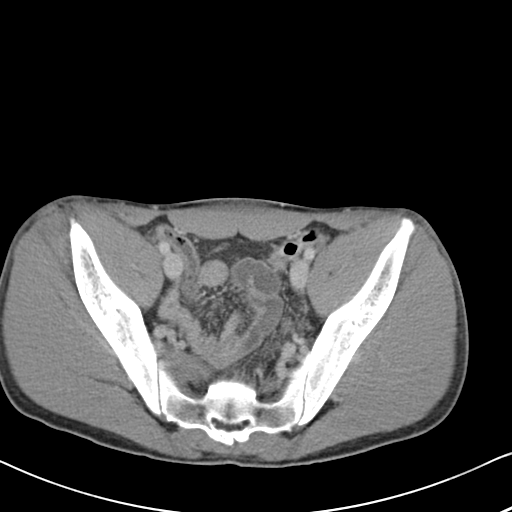
[im 39/99  soft-tissue]
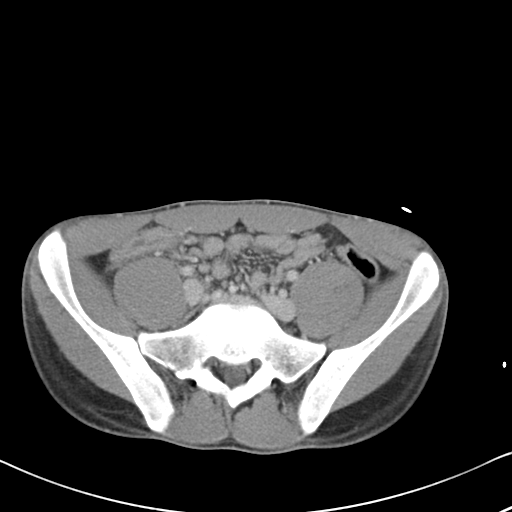
[im 47/99  soft-tissue]
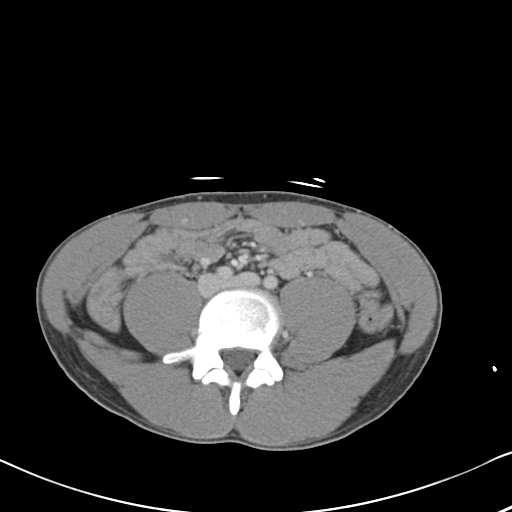
[im 52/99  soft-tissue]
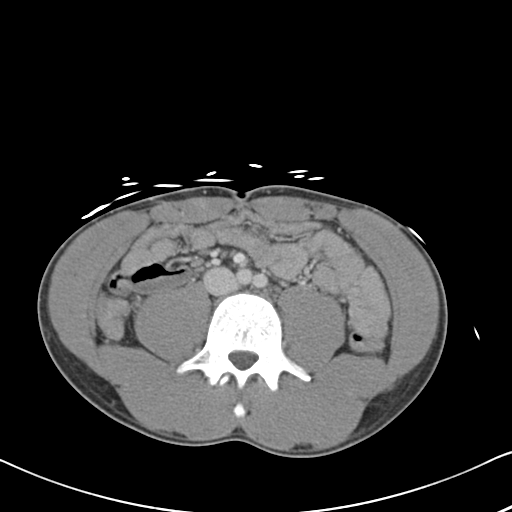
[im 60/99  soft-tissue]
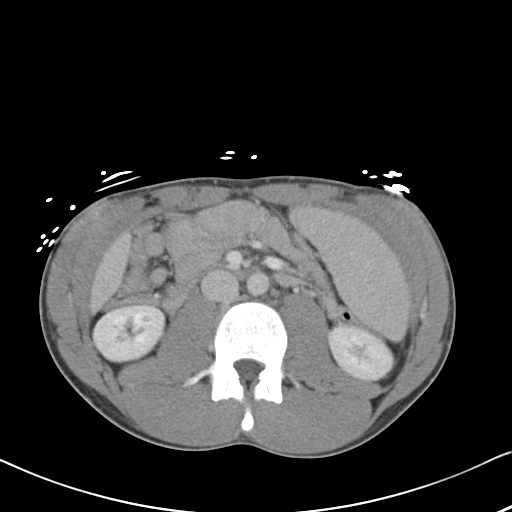
[im 69/99  soft-tissue]
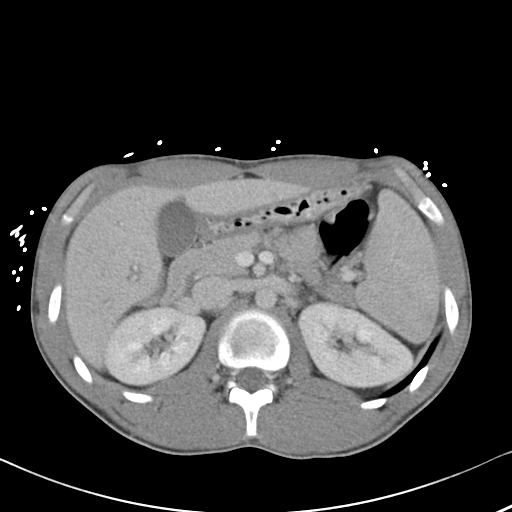
[im 69/99  bone]
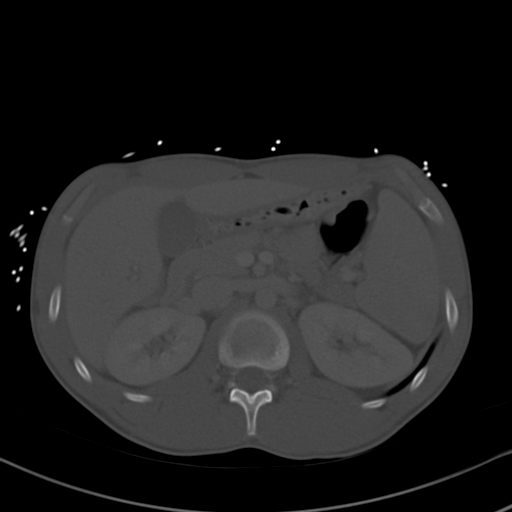
[im 77/99  soft-tissue]
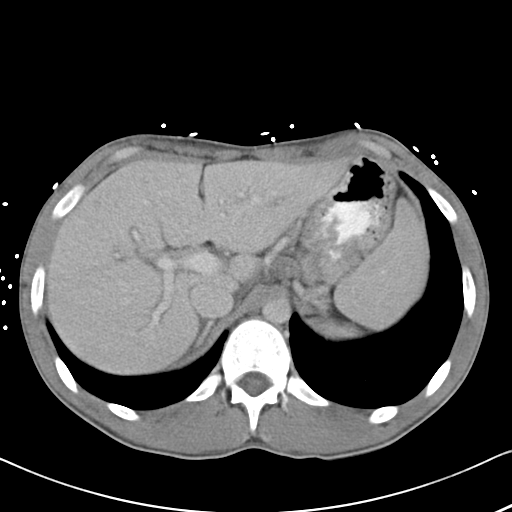
[im 86/99  soft-tissue]
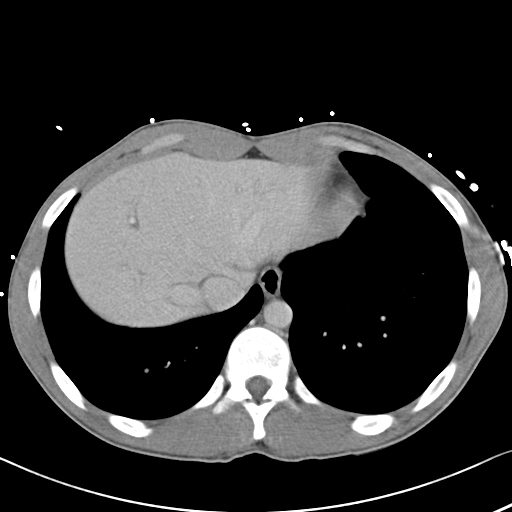
[im 94/99  soft-tissue]
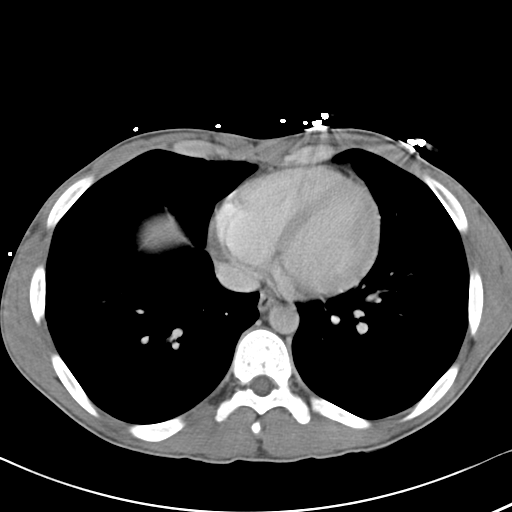

[Series 5: cor routine abd pel with · coronal · 0.62mm/px · 3 of 107 slices shown]
[im 36/107  soft-tissue]
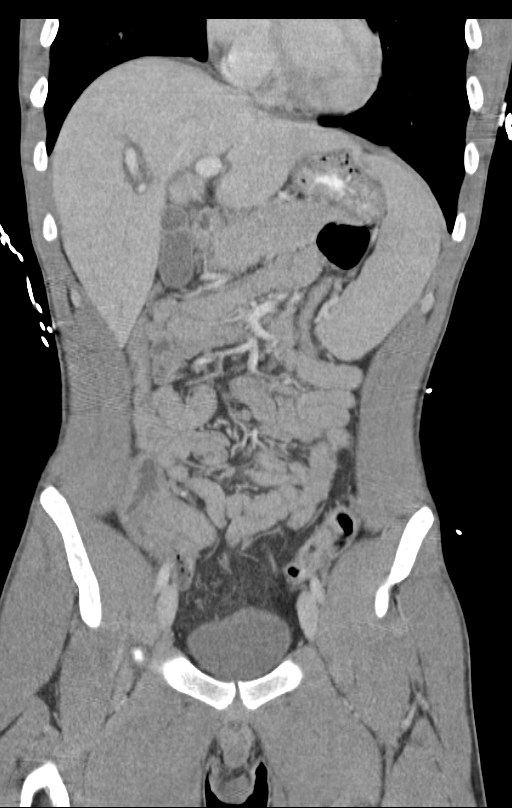
[im 48/107  soft-tissue]
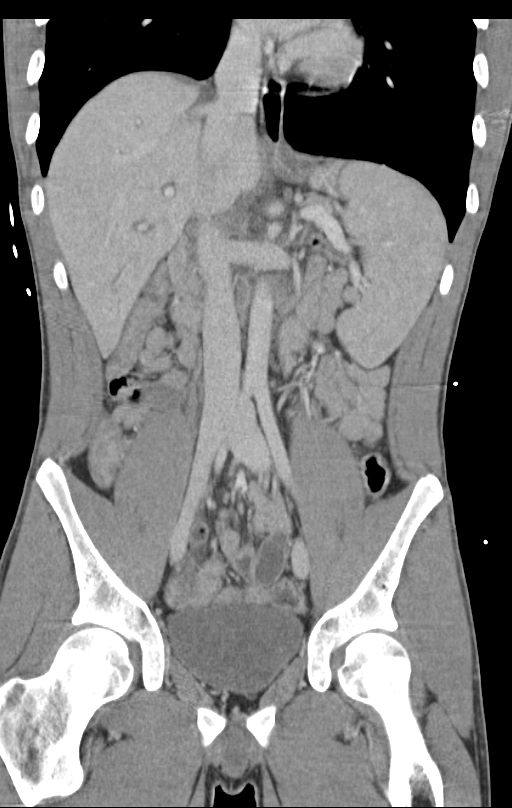
[im 59/107  soft-tissue]
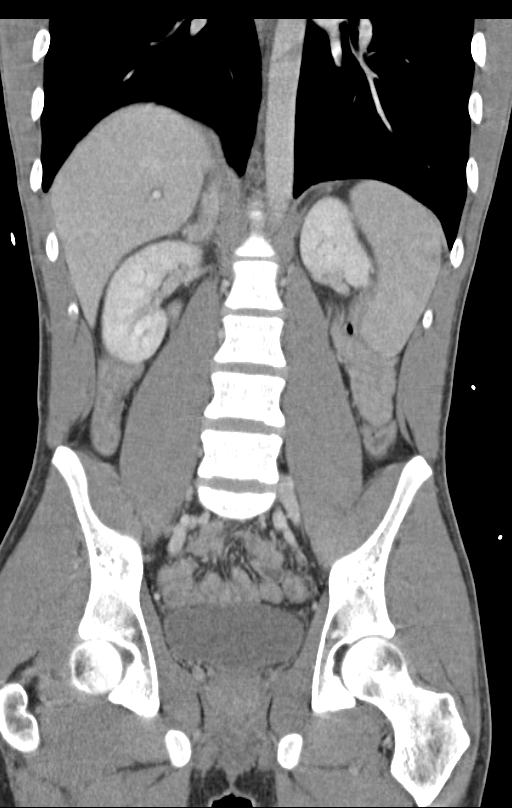

[15 of 46 positions shown; findings below may reference images not displayed]

FINDINGS: Lower chest:  Unremarkable.

Hepatobiliary: Changes of periportal edema noted in the liver. No
focal enhancing liver lesion. There is no evidence for gallstones,
gallbladder wall thickening, or pericholecystic fluid. No
intrahepatic or extrahepatic biliary dilation.

Pancreas: No focal mass lesion. No dilatation of the main duct. No
intraparenchymal cyst. No peripancreatic edema.

Spleen: Spleen measures 13.2 cm in craniocaudal length, upper
normal.

Adrenals/Urinary Tract: No adrenal nodule or mass. Kidneys have
normal CT imaging features. No evidence for hydroureter. The urinary
bladder appears normal for the degree of distention.

Stomach/Bowel: Stomach is nondistended. No gastric wall thickening.
No evidence of outlet obstruction. Duodenum is normally positioned
as is the ligament of Treitz. No small bowel wall thickening. No
small bowel dilatation. The terminal ileum is normal. The appendix
eat is dilated up to 13 mm with fluid, gas, and patulous identified
in the lumen. No substantial periappendiceal edema or inflammation.
Colon is diffusely decompressed.

Vascular/Lymphatic: No abdominal aortic aneurysm. No abdominal
aortic atherosclerotic calcification. There is no gastrohepatic or
hepatoduodenal ligament lymphadenopathy. No intraperitoneal or
retroperitoneal lymphadenopathy. No pelvic sidewall lymphadenopathy.

Reproductive: The prostate gland and seminal vesicles have normal
imaging features.

Other: Small volume intraperitoneal free fluid evident.

Musculoskeletal: Probable bone island in the right inferior pubic
ramus. Bone windows reveal no worrisome lytic or sclerotic osseous
lesions.
IMPRESSION: 1. Appendix appears slightly dilated with fluid, gas, and
appendicolith is visible in the lumen. No substantial
periappendiceal edema or inflammation. Imaging features may be
nonacute, but appendicitis cannot be excluded by imaging alone.
2. Periportal edema noted in the liver. Nonspecific but can be
related to hepatitis, bowel pathology, or trauma.
3. Small volume intraperitoneal free fluid in the anatomic pelvis.

## 2018-02-07 ENCOUNTER — Emergency Department (HOSPITAL_COMMUNITY)
Admission: EM | Admit: 2018-02-07 | Discharge: 2018-02-07 | Disposition: A | Discharge status: Home / Self Care | Payer: Medicaid Other | Attending: Emergency Medicine | Admitting: Emergency Medicine

## 2018-02-07 ENCOUNTER — Other Ambulatory Visit: Payer: Self-pay

## 2018-02-07 ENCOUNTER — Encounter (HOSPITAL_COMMUNITY): Payer: Self-pay

## 2018-02-07 DIAGNOSIS — F17210 Nicotine dependence, cigarettes, uncomplicated: Secondary | ICD-10-CM | POA: Insufficient documentation

## 2018-02-07 DIAGNOSIS — R197 Diarrhea, unspecified: Secondary | ICD-10-CM | POA: Insufficient documentation

## 2018-02-07 DIAGNOSIS — J111 Influenza due to unidentified influenza virus with other respiratory manifestations: Secondary | ICD-10-CM | POA: Insufficient documentation

## 2018-02-07 DIAGNOSIS — R69 Illness, unspecified: Secondary | ICD-10-CM

## 2018-02-07 DIAGNOSIS — J45909 Unspecified asthma, uncomplicated: Secondary | ICD-10-CM | POA: Insufficient documentation

## 2018-02-07 MED ORDER — ONDANSETRON 8 MG PO TBDP: 8.0000 mg | ORAL_TABLET | Freq: Three times a day (TID) | ORAL | 0 refills | Status: DC | PRN

## 2018-02-07 NOTE — ED Triage Notes (Signed)
Per [facility]. Pt c/o fever N/V/ cough and body aches x 1 day with diarrhea starting today. No OTC meds. No flu shot taken

## 2018-02-07 NOTE — ED Provider Notes (Signed)
[facility]-EMERGENCY DEPT Provider Note   CSN: [id number] Arrival date & time: 02/07/18  6237     History   Chief Complaint Chief Complaint  Patient presents with  . Fever  . Generalized Body Aches  . Nausea  . Emesis  . Cough    HPI Derrick Leonard is a 23 y.o. male.  HPI  23 year old male presents today complaining of loose bowel movements up to 3 to 5/day for the past week with fever beginning 3 days ago.  He has had some nausea but no vomiting.  He also reports nasal congestion, body ache, fever to 102, and cough.  He did not have a flu shot this year.  He has no definite known sick contacts.  He has no other significant medical problems.  He had an appendectomy last year.  He is a smoker with last cigarette this morning.  Past Medical History:  Diagnosis Date  . Asthma     Patient Active Problem List   Diagnosis Date Noted  . Appendicitis, acute     Past Surgical History:  Procedure Laterality Date  . LAPAROSCOPIC APPENDECTOMY N/A 06/30/2015   Procedure: APPENDECTOMY LAPAROSCOPIC;  Surgeon: Leafy Ro, MD;  Location: [facility] ORS;  Service: General;  Laterality: N/A;        Home Medications    Prior to Admission medications   Medication Sig Start Date End Date Taking? Authorizing Provider  diphenhydrAMINE (BENADRYL) 25 mg capsule Take 2 capsules (50 mg total) by mouth every 4 (four) hours as needed. 07/15/16 07/15/17  Nita Sickle, MD  famotidine (PEPCID) 20 MG tablet Take 1 tablet (20 mg total) by mouth daily. 07/12/16 07/12/17  Cuthriell, Delorise Royals, PA-C  HYDROcodone-acetaminophen (NORCO/VICODIN) 5-325 MG tablet Take 1-2 tablets by mouth every 4 (four) hours as needed for moderate pain. Patient not taking: Reported on 07/02/2016 07/01/15   Leafy Ro, MD  ondansetron (ZOFRAN ODT) 4 MG disintegrating tablet Take 1 tablet (4 mg total) by mouth every 8 (eight) hours as needed for nausea or vomiting. 07/08/16   Merrily Brittle, MD    oxyCODONE-acetaminophen (ROXICET) 5-325 MG tablet Take 1 tablet by mouth every 4 (four) hours as needed for severe pain. Patient not taking: Reported on 07/02/2016 07/05/15   Irean Hong, MD  predniSONE (DELTASONE) 50 MG tablet Take 1 tablet (50 mg total) by mouth daily with breakfast. 07/12/16   Cuthriell, Delorise Royals, PA-C    Family History History reviewed. No pertinent family history.  Social History Social History   Tobacco Use  . Smoking status: Current Every Day Smoker    Packs/day: 1.00    Types: Cigarettes  . Smokeless tobacco: Never Used  Substance Use Topics  . Alcohol use: Yes    Comment: occ  . Drug use: Yes    Types: Marijuana    Comment: occasionally     Allergies   Amoxicillin   Review of Systems Review of Systems  All other systems reviewed and are negative.    Physical Exam Updated Vital Signs BP 122/77   Pulse 67   Temp 99 F (37.2 C) (Oral)   Resp 16   SpO2 98%   Physical Exam Vitals signs and nursing note reviewed.  Constitutional:      Appearance: Normal appearance. He is normal weight.  HENT:     Head: Normocephalic and atraumatic.     Right Ear: External ear normal.     Left Ear: External ear normal.  Nose: Nose normal.     Mouth/Throat:     Mouth: Mucous membranes are moist.     Pharynx: Oropharynx is clear.  Eyes:     Extraocular Movements: Extraocular movements intact.     Pupils: Pupils are equal, round, and reactive to light.  Neck:     Musculoskeletal: Normal range of motion.  Cardiovascular:     Rate and Rhythm: Normal rate and regular rhythm.  Pulmonary:     Effort: Pulmonary effort is normal.     Breath sounds: Normal breath sounds.  Abdominal:     General: Abdomen is flat.     Palpations: Abdomen is soft.  Musculoskeletal: Normal range of motion.  Skin:    General: Skin is warm and dry.     Capillary Refill: Capillary refill takes less than 2 seconds.  Neurological:     General: No focal deficit present.      Mental Status: He is alert.  Psychiatric:        Mood and Affect: Mood normal.        Behavior: Behavior normal.      ED Treatments / Results  Labs (all labs ordered are listed, but only abnormal results are displayed) Labs Reviewed - No data to display  EKG None  Radiology No results found.  Procedures Procedures (including critical care time)  Medications Ordered in ED Medications - No data to display   Initial Impression / Assessment and Plan / ED Course  I have reviewed the triage vital signs and the nursing notes.  Pertinent labs & imaging results that were available during my care of the patient were reviewed by me and considered in my medical decision making (see chart for details).     Well-appearing 23 year old male presents today with influenza-like illness.  Patient symptoms have been going on for 3 to 4 days to 1 week.  Do not feel that he would benefit from influenza testing.  He does not appear to be significantly volume depleted.  We have discussed oral hydration.  We discussed return precautions and need for follow-up and voiced understanding.  Final Clinical Impressions(s) / ED Diagnoses   Final diagnoses:  Influenza-like illness  Diarrhea, unspecified type    ED Discharge Orders    None       Margarita Grizzle, MD 02/07/18 [phone]

## 2018-02-07 NOTE — Discharge Instructions (Signed)
Your symptoms today are consistent with influenza type illness.  However, given the amount of time you have symptoms it is unlikely that any specific antiviral medications would benefit you.  You are given a prescription for Zofran for nausea.  Please use regular Gatorade mixed half-and-half with water for oral hydration until the diarrhea has resolved.

## 2018-02-07 NOTE — ED Notes (Signed)
Bed: [facility] Expected date:  Expected time:  Means of arrival:  Comments: EMS-fever

## 2019-06-28 ENCOUNTER — Telehealth: Payer: Self-pay | Admitting: Family Medicine

## 2019-06-28 NOTE — Telephone Encounter (Signed)
Patient states he was tested positive for HIV in 2017 and is currently having some issues that are concering

## 2019-06-28 NOTE — Telephone Encounter (Signed)
Returned patient phone call to provided number. Patient states he had explained in "confidentiality that he was HIV+ " but that his concern was the foreskin on his penis. States that the foreskin has become "tightened and is pulling on his penis." States he had a PCP but lost his Medicaid and has not followed up on +HIV status in the past 2 years. RN counseled that we provide HIV testing and would provide referral information for +results. RN also recommended that patient follow up with PCP, Urgent Care or local ED for evaluation of above concerns. Patient agreeable to recommendations and plans to keep 07/02/19 scheduled STI appt. here. Tawny Hopping, RN

## 2019-07-01 ENCOUNTER — Encounter: Payer: Self-pay | Admitting: Emergency Medicine

## 2019-07-01 ENCOUNTER — Emergency Department
Admission: EM | Admit: 2019-07-01 | Discharge: 2019-07-01 | Disposition: A | Discharge status: Home / Self Care | Payer: Medicaid Other | Attending: Emergency Medicine | Admitting: Emergency Medicine

## 2019-07-01 ENCOUNTER — Other Ambulatory Visit: Payer: Self-pay

## 2019-07-01 DIAGNOSIS — F17210 Nicotine dependence, cigarettes, uncomplicated: Secondary | ICD-10-CM | POA: Insufficient documentation

## 2019-07-01 DIAGNOSIS — Z21 Asymptomatic human immunodeficiency virus [HIV] infection status: Secondary | ICD-10-CM | POA: Insufficient documentation

## 2019-07-01 DIAGNOSIS — R300 Dysuria: Secondary | ICD-10-CM

## 2019-07-01 DIAGNOSIS — J45909 Unspecified asthma, uncomplicated: Secondary | ICD-10-CM | POA: Insufficient documentation

## 2019-07-01 DIAGNOSIS — A749 Chlamydial infection, unspecified: Secondary | ICD-10-CM | POA: Insufficient documentation

## 2019-07-01 LAB — URINALYSIS, COMPLETE (UACMP) WITH MICROSCOPIC
Bilirubin Urine: NEGATIVE
Glucose, UA: NEGATIVE mg/dL
Hgb urine dipstick: NEGATIVE
Ketones, ur: NEGATIVE mg/dL
Nitrite: NEGATIVE
Protein, ur: NEGATIVE mg/dL
Specific Gravity, Urine: 1.013 (ref 1.005–1.030)
pH: 5 (ref 5.0–8.0)

## 2019-07-01 LAB — CHLAMYDIA/NGC RT PCR (ARMC ONLY)
Chlamydia Tr: DETECTED — AB
N gonorrhoeae: NOT DETECTED

## 2019-07-01 MED ORDER — AZITHROMYCIN 500 MG PO TABS
1000.0000 mg | ORAL_TABLET | Freq: Once | ORAL | Status: AC
Start: 2019-07-01 — End: 2019-07-01
  Administered 2019-07-01: 1000 mg via ORAL
  Filled 2019-07-01: qty 2

## 2019-07-01 MED ORDER — GENTAMICIN SULFATE 40 MG/ML IJ SOLN: 240.0000 mg | Freq: Once | INTRAMUSCULAR | Status: DC

## 2019-07-01 MED ORDER — DOXYCYCLINE HYCLATE 100 MG PO CAPS: 100.0000 mg | ORAL_CAPSULE | Freq: Two times a day (BID) | ORAL | 0 refills | Status: DC

## 2019-07-01 NOTE — Discharge Instructions (Signed)
Follow-up with the [facility] department STD clinic if any continued problems or if you wish to be tested for other sexually transmitted diseases such as hepatitis or syphilis.  Also a paper was attached to your discharge papers so that your partner can also be seen at the health department and treated for free.  Take medication sent to your pharmacy until completely finished.  Avoid bright light in the sun to prevent burning while taking the doxycycline.
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
## 2019-07-01 NOTE — ED Provider Notes (Signed)
[facility] Emergency Department Provider Note  ____________________________________________   First MD Initiated Contact with Patient 07/01/19 [phone]     (approximate)  I have reviewed the triage vital signs and the nursing notes.   HISTORY  Chief Complaint Dysuria   HPI Derrick Leonard is a 24 y.o. male presents to the ED with complaint of dysuria.  Patient states that this is been going on for 2 months.  Patient was seen at a urgent care a week ago and was given some cream to use thinking that his pain was coming because he was uncircumcised.  Patient states he has no problems retracting the foreskin.  Currently he denies any penile discharge.  He states that he is sexually active and use condoms.  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       Past Medical History:  Diagnosis Date  . Asthma     Patient Active Problem List   Diagnosis Date Noted  . Appendicitis, acute     Past Surgical History:  Procedure Laterality Date  . LAPAROSCOPIC APPENDECTOMY N/A 06/30/2015   Procedure: APPENDECTOMY LAPAROSCOPIC;  Surgeon: Jules Husbands, MD;  Location: [facility] ORS;  Service: General;  Laterality: N/A;    Prior to Admission medications   Medication Sig Start Date End Date Taking? Authorizing Provider  diphenhydrAMINE (BENADRYL) 25 mg capsule Take 2 capsules (50 mg total) by mouth every 4 (four) hours as needed. 07/15/16 07/15/17  Rudene Re, MD  doxycycline (VIBRAMYCIN) 100 MG capsule Take 1 capsule (100 mg total) by mouth 2 (two) times daily. 07/01/19   Johnn Hai, PA-C  famotidine (PEPCID) 20 MG tablet Take 1 tablet (20 mg total) by mouth daily. 07/12/16 07/12/17  Cuthriell, Charline Bills, PA-C    Allergies Amoxicillin  History reviewed. No pertinent family history.  Social History Social History   Tobacco Use  . Smoking status: Current Every Day Smoker    Packs/day: 1.00    Types: Cigarettes  . Smokeless tobacco: Never Used  Substance Use  Topics  . Alcohol use: Yes    Comment: occ  . Drug use: Yes    Types: Marijuana    Comment: occasionally    Review of Systems Constitutional: No fever/chills Eyes: No visual changes. ENT: No sore throat. Cardiovascular: Denies chest pain. Respiratory: Denies shortness of breath. Gastrointestinal: No abdominal pain.  No nausea, no vomiting.  Genitourinary: Positive for dysuria. Musculoskeletal: Negative for back pain. Skin: Negative for rash. Neurological: Negative for headaches, focal weakness or numbness. ____________________________________________   PHYSICAL EXAM:  VITAL SIGNS: ED Triage Vitals [07/01/19 0831]  Enc Vitals Group     BP (!) 144/90     Pulse Rate 71     Resp 18     Temp 98.4 F (36.9 C)     Temp Source Oral     SpO2 100 %     Weight 140 lb (63.5 kg)     Height 5\' 9"  (1.753 m)     Head Circumference      Peak Flow      Pain Score 9     Pain Loc      Pain Edu?      Excl. in [place]?     Constitutional: Alert and oriented. Well appearing and in no acute distress. Eyes: Conjunctivae are normal.  Head: Atraumatic. Neck: No stridor.   Cardiovascular: Normal rate, regular rhythm. Grossly normal heart sounds.  Good peripheral circulation. Respiratory: Normal respiratory effort.  No retractions.  Lungs CTAB. Genitourinary: Patient is able to retract foreskin without any difficulty.  No penile discharge is noted however there is moderate clear fluid noted around the head of the penis.  Skin is irritated but no pustules or vesicles noted. Musculoskeletal: Moves upper and lower extremities they have difficulty.  Normal gait was noted. Neurologic:  Normal speech and language. No gross focal neurologic deficits are appreciated.  Skin:  Skin is warm, dry and intact. No rash noted. Psychiatric: Mood and affect are normal. Speech and behavior are normal.  ____________________________________________   LABS (all labs ordered are listed, but only abnormal results  are displayed)  Labs Reviewed  CHLAMYDIA/NGC RT PCR ([facility] ONLY) - Abnormal; Notable for the following components:      Result Value   Chlamydia Tr DETECTED (*)    All other components within normal limits  URINALYSIS, COMPLETE (UACMP) WITH MICROSCOPIC - Abnormal; Notable for the following components:   Color, Urine YELLOW (*)    APPearance CLEAR (*)    Leukocytes,Ua TRACE (*)    Bacteria, UA RARE (*)    All other components within normal limits  URINE CULTURE   PROCEDURES  Procedure(s) performed (including Critical Care):  Procedures   ____________________________________________   INITIAL IMPRESSION / ASSESSMENT AND PLAN / ED COURSE  As part of my medical decision making, I reviewed the following data within the electronic MEDICAL RECORD NUMBER Notes from prior ED visits and [place] Controlled Substance Database  24 year old male presents to the ED with complaint of dysuria.  Patient was seen at urgent care and given a cream to use because he was uncircumcised.  Patient states he is continue to have pain with urination even with using the cream.  He denies any penile discharge.  Patient is sexually active uses condoms.  Patient is HIV positive.  His urinalysis did show bacteria with WBCs.  GC and Chlamydia was positive for chlamydia.  Patient was given Zithromax p.o. while in the ED.  Because this has been going on for approximately 2 months he was also placed on doxycycline 100 mg twice daily.  He was given information about the [facility] department STD clinic.  He is encouraged to have his partner be seen there, have test and be treated. ____________________________________________   FINAL CLINICAL IMPRESSION(S) / ED DIAGNOSES  Final diagnoses:  Chlamydia infection  Dysuria     ED Discharge Orders         Ordered    doxycycline (VIBRAMYCIN) 100 MG capsule  2 times daily     Discontinue  Reprint     07/01/19 1008           Note:  This document was  prepared using Dragon voice recognition software and may include unintentional dictation errors.    Tommi Rumps, PA-C 07/01/19 1419    Jene Every, MD 07/01/19 1422

## 2019-07-01 NOTE — ED Notes (Signed)
esignature pad not working in room. Signature page printed out and pt signed on paper. Placed in medical records bin.

## 2019-07-02 ENCOUNTER — Ambulatory Visit: Payer: Self-pay | Admitting: Physician Assistant

## 2019-07-02 DIAGNOSIS — A539 Syphilis, unspecified: Secondary | ICD-10-CM

## 2019-07-02 DIAGNOSIS — Z113 Encounter for screening for infections with a predominantly sexual mode of transmission: Secondary | ICD-10-CM

## 2019-07-02 LAB — URINE CULTURE: Culture: NO GROWTH

## 2019-07-02 MED ORDER — DOXYCYCLINE HYCLATE 100 MG PO TABS: 100.0000 mg | ORAL_TABLET | Freq: Two times a day (BID) | ORAL | 0 refills | Status: AC

## 2019-07-04 ENCOUNTER — Encounter: Payer: Self-pay | Admitting: Physician Assistant

## 2019-07-04 DIAGNOSIS — A539 Syphilis, unspecified: Secondary | ICD-10-CM | POA: Insufficient documentation

## 2019-07-04 NOTE — Progress Notes (Signed)
[facility] Department STI clinic/screening visit  Subjective:  Derrick Leonard is a 24 y.o. male being seen today for an STI screening visit. The patient reports they do have symptoms.    Patient has the following medical conditions:   Patient Active Problem List   Diagnosis Date Noted  . Appendicitis, acute      Chief Complaint  Patient presents with  . SEXUALLY TRANSMITTED DISEASE    screening    HPI  Patient reports that he has been seen at the ER and diagnosed with a "bacterial infection" under the foreskin and Chlamydia.  States that he has had a sore, irritated feeling on the head of the penis for 2 months.  Reports that it hurts to retract his foreskin and that he thinks that he has a sore.  Reports that he is HIV positive but has gotten out of care and is not currently taking medicines.  States that he plans to get back into care with previous provider soon.    See flowsheet for further details and programmatic requirements.    The following portions of the patient's history were reviewed and updated as appropriate: allergies, current medications, past medical history, past social history, past surgical history and problem list.  Objective:  There were no vitals filed for this visit.  Physical Exam Constitutional:      General: He is not in acute distress.    Appearance: Normal appearance.  HENT:     Head: Normocephalic and atraumatic.     Comments: No nits, lice, or hair loss. No cervical, supraclavicular or axillary adenopathy.    Mouth/Throat:     Mouth: Mucous membranes are moist.     Pharynx: Oropharynx is clear. No oropharyngeal exudate or posterior oropharyngeal erythema.  Eyes:     Conjunctiva/sclera: Conjunctivae normal.  Pulmonary:     Effort: Pulmonary effort is normal.  Abdominal:     Palpations: Abdomen is soft. There is no mass.     Tenderness: There is no abdominal tenderness. There is no guarding or rebound.  Genitourinary:     Testes: Normal.     Comments: Pubic area without nits, lice, edema, erythema, lesions and inguinal adenopathy. Penis uncircumcised, with slightly ulcerative, tender area with rolled borders ~1cm x 0.5 cm,on foreskin after retracted.  Head of penis without lesions and no discharge from meatus.  Musculoskeletal:     Cervical back: Neck supple. No tenderness.  Skin:    General: Skin is warm and dry.     Findings: No bruising, erythema, lesion or rash.  Neurological:     Mental Status: He is alert and oriented to person, place, and time.  Psychiatric:        Mood and Affect: Mood normal.        Behavior: Behavior normal.        Thought Content: Thought content normal.        Judgment: Judgment normal.       Assessment and Plan:  Derrick Leonard is a 24 y.o. male presenting to the [facility] Department for STI screening  1. Screening for STD (sexually transmitted disease) Reviewed note from ER visit and patient given Azithromycin 1g po at his visit.   Rec no sex for 14 days, until lesion has healed and until after results are back. Rec condoms with all sex. Info on Home Care Providers given to patient to help him navigate getting back into care with ID provider and Medicaid paperwork straightened  back. - Syphilis Serology, [facility] - Virology, [facility]  2. Syphilis Will treat for possible Syphilis with Doxycycline 100mg  #28 1 po BID for 14 days Counseled patient that once we have his results we will decide whether he needs further treatment. Counseled patient that if his test is positive for Syphilis, he will be contacted by [facility] and interviewed. Rec RTC for titer check at 6 months, 12 months and annually. - doxycycline (VIBRA-TABS) 100 MG tablet; Take 1 tablet (100 mg total) by mouth 2 (two) times daily for 14 days.  Dispense: 28 tablet; Refill: 0     No follow-ups on file.  No future appointments.  Jerene Dilling, PA

## 2019-07-06 NOTE — Progress Notes (Signed)
Chart reviewed by Pharmacist  Suzanne Walker PharmD, Contract Pharmacist at [facility]  

## 2019-07-10 ENCOUNTER — Telehealth: Payer: Self-pay | Admitting: Family Medicine

## 2019-07-10 NOTE — Telephone Encounter (Signed)
TC with patient.  Verified ID via password/SS#. Discussed +HSV 2 results, disease process and treatment options. Questions and concerns addressed. Patient verbalized understanding of results.  Appointment scheduled for treatment.

## 2019-07-11 ENCOUNTER — Encounter: Payer: Self-pay | Admitting: Physician Assistant

## 2019-07-11 ENCOUNTER — Ambulatory Visit: Payer: Self-pay | Admitting: Physician Assistant

## 2019-07-11 ENCOUNTER — Other Ambulatory Visit: Payer: Self-pay

## 2019-07-11 DIAGNOSIS — Z7189 Other specified counseling: Secondary | ICD-10-CM

## 2019-07-11 DIAGNOSIS — B009 Herpesviral infection, unspecified: Secondary | ICD-10-CM

## 2019-07-11 DIAGNOSIS — Z299 Encounter for prophylactic measures, unspecified: Secondary | ICD-10-CM

## 2019-07-11 MED ORDER — ACYCLOVIR 800 MG PO TABS: 800.0000 mg | ORAL_TABLET | Freq: Every day | ORAL | 12 refills | Status: DC

## 2019-07-11 MED ORDER — ACYCLOVIR 400 MG PO TABS: 400.0000 mg | ORAL_TABLET | Freq: Two times a day (BID) | ORAL | 0 refills | Status: AC

## 2019-07-11 NOTE — Progress Notes (Signed)
S:  Patient RTC for counseling and treatment today.  Denies questions today.  States that he spoke with RN yesterday and has no questions. 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  A/P:1.  Patient into clinic for treatment. 2. 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 Offered patient Rx for episodic treatment or suppressive treatment and patient opts for suppressive treatment and Rx written for patient.  Counseled patient to call or RTC with other questions or concerns.  Counseled patient that to continue prescribing for this condition we have to see them at least 1 time a year for a face-to-face visit. 3.  Patient given Acyclovir 400mg  #240 1 po BID for 120 days from stock and Rx for Acyclovir 800mg  #30 1 po daily with refills for 1 year. 4.  Rec condoms with all sex. 5.  RTC prn.

## 2019-07-15 NOTE — Progress Notes (Signed)
Chart reviewed by Pharmacist  Suzanne Walker PharmD, Contract Pharmacist at [facility]  

## 2019-07-29 ENCOUNTER — Encounter: Payer: Self-pay | Admitting: Emergency Medicine

## 2019-07-29 ENCOUNTER — Other Ambulatory Visit: Payer: Self-pay

## 2019-07-29 ENCOUNTER — Emergency Department
Admission: EM | Admit: 2019-07-29 | Discharge: 2019-07-29 | Disposition: A | Discharge status: Home / Self Care | Payer: Medicaid Other | Attending: Emergency Medicine | Admitting: Emergency Medicine

## 2019-07-29 DIAGNOSIS — E860 Dehydration: Secondary | ICD-10-CM | POA: Insufficient documentation

## 2019-07-29 DIAGNOSIS — R112 Nausea with vomiting, unspecified: Secondary | ICD-10-CM

## 2019-07-29 DIAGNOSIS — F17210 Nicotine dependence, cigarettes, uncomplicated: Secondary | ICD-10-CM | POA: Insufficient documentation

## 2019-07-29 DIAGNOSIS — J45909 Unspecified asthma, uncomplicated: Secondary | ICD-10-CM | POA: Insufficient documentation

## 2019-07-29 LAB — URINALYSIS, COMPLETE (UACMP) WITH MICROSCOPIC
Bacteria, UA: NONE SEEN
Bilirubin Urine: NEGATIVE
Glucose, UA: NEGATIVE mg/dL
Hgb urine dipstick: NEGATIVE
Ketones, ur: NEGATIVE mg/dL
Leukocytes,Ua: NEGATIVE
Nitrite: NEGATIVE
Protein, ur: 30 mg/dL — AB
Specific Gravity, Urine: 1.025 (ref 1.005–1.030)
Squamous Epithelial / LPF: NONE SEEN (ref 0–5)
pH: 6 (ref 5.0–8.0)

## 2019-07-29 LAB — CBC
HCT: 33.2 % — ABNORMAL LOW (ref 39.0–52.0)
Hemoglobin: 11.9 g/dL — ABNORMAL LOW (ref 13.0–17.0)
MCH: 31.6 pg (ref 26.0–34.0)
MCHC: 35.8 g/dL (ref 30.0–36.0)
MCV: 88.1 fL (ref 80.0–100.0)
Platelets: 63 10*3/uL — ABNORMAL LOW (ref 150–400)
RBC: 3.77 MIL/uL — ABNORMAL LOW (ref 4.22–5.81)
RDW: 15.3 % (ref 11.5–15.5)
WBC: 3.3 10*3/uL — ABNORMAL LOW (ref 4.0–10.5)
nRBC: 0 % (ref 0.0–0.2)

## 2019-07-29 LAB — COMPREHENSIVE METABOLIC PANEL
ALT: 29 U/L (ref 0–44)
AST: 25 U/L (ref 15–41)
Albumin: 4.2 g/dL (ref 3.5–5.0)
Alkaline Phosphatase: 49 U/L (ref 38–126)
Anion gap: 9 (ref 5–15)
BUN: 19 mg/dL (ref 6–20)
CO2: 24 mmol/L (ref 22–32)
Calcium: 8.6 mg/dL — ABNORMAL LOW (ref 8.9–10.3)
Chloride: 98 mmol/L (ref 98–111)
Creatinine, Ser: 1.01 mg/dL (ref 0.61–1.24)
GFR calc Af Amer: >60 mL/min (ref >60)
GFR calc non Af Amer: >60 mL/min (ref >60)
Glucose, Bld: 97 mg/dL (ref 70–99)
Potassium: 4.5 mmol/L (ref 3.5–5.1)
Sodium: 131 mmol/L — ABNORMAL LOW (ref 135–145)
Total Bilirubin: 0.8 mg/dL (ref 0.3–1.2)
Total Protein: 8.5 g/dL — ABNORMAL HIGH (ref 6.5–8.1)

## 2019-07-29 LAB — LIPASE, BLOOD: Lipase: 30 U/L (ref 11–51)

## 2019-07-29 MED ORDER — SODIUM CHLORIDE 0.9% FLUSH: 3.0000 mL | Freq: Once | INTRAVENOUS | Status: DC

## 2019-07-29 MED ORDER — SODIUM CHLORIDE 0.9 % IV BOLUS
1000.0000 mL | Freq: Once | INTRAVENOUS | Status: AC
  Administered 2019-07-29: 1000 mL via INTRAVENOUS

## 2019-07-29 MED ORDER — ONDANSETRON HCL 4 MG/2ML IJ SOLN
4.0000 mg | Freq: Once | INTRAMUSCULAR | Status: AC
  Administered 2019-07-29: 4 mg via INTRAVENOUS
  Filled 2019-07-29: qty 2

## 2019-07-29 MED ORDER — ONDANSETRON HCL 4 MG PO TABS: 4.0000 mg | ORAL_TABLET | Freq: Every day | ORAL | 0 refills | Status: DC | PRN

## 2019-07-29 NOTE — ED Provider Notes (Signed)
[facility] Emergency Department Provider Note  Time seen: 7:43 PM  I have reviewed the triage vital signs and the nursing notes.   HISTORY  Chief Complaint Emesis   HPI Derrick Leonard is a 24 y.o. male with a past medical history of asthma,  HIV, presents to the emergency department for anxiety nausea and vomiting.  According to the patient for the past several days he states his anxiety has been much worse, states he is nauseated with frequent episodes of vomiting.  Denies any abdominal pain.  Denies any diarrhea.  Denies chest pain shortness of breath or fever.  Patient states he was diagnosed with HIV in 2017 and took antiviral medications for several months but then stopped.  Has not been on any HIV medication since.  Past Medical History:  Diagnosis Date  . Asthma     Patient Active Problem List   Diagnosis Date Noted  . Syphilis 07/04/2019  . Appendicitis, acute     Past Surgical History:  Procedure Laterality Date  . LAPAROSCOPIC APPENDECTOMY N/A 06/30/2015   Procedure: APPENDECTOMY LAPAROSCOPIC;  Surgeon: Leafy Ro, MD;  Location: [facility] ORS;  Service: General;  Laterality: N/A;    Prior to Admission medications   Medication Sig Start Date End Date Taking? Authorizing Provider  acyclovir (ZOVIRAX) 400 MG tablet Take 1 tablet (400 mg total) by mouth 2 (two) times daily. 07/11/19 11/08/19  Matt Holmes, PA  acyclovir (ZOVIRAX) 800 MG tablet Take 1 tablet (800 mg total) by mouth daily. 11/10/19   Matt Holmes, PA  diphenhydrAMINE (BENADRYL) 25 mg capsule Take 2 capsules (50 mg total) by mouth every 4 (four) hours as needed. 07/15/16 07/15/17  Nita Sickle, MD  doxycycline (VIBRAMYCIN) 100 MG capsule Take 1 capsule (100 mg total) by mouth 2 (two) times daily. 07/01/19   Tommi Rumps, PA-C  famotidine (PEPCID) 20 MG tablet Take 1 tablet (20 mg total) by mouth daily. 07/12/16 07/12/17  Cuthriell, Delorise Royals, PA-C    Allergies   Allergen Reactions  . Cheese Swelling  . Amoxicillin Rash    No family history on file.  Social History Social History   Tobacco Use  . Smoking status: Current Every Day Smoker    Packs/day: 1.00    Types: Cigarettes  . Smokeless tobacco: Never Used  Substance Use Topics  . Alcohol use: Yes    Comment: occ  . Drug use: Yes    Types: Marijuana    Comment: occasionally    Review of Systems Constitutional: Negative for fever. Cardiovascular: Negative for chest pain. Respiratory: Negative for shortness of breath. Gastrointestinal: Negative for abdominal pain.  Positive for nausea vomiting. Genitourinary: Negative for urinary compaints Musculoskeletal: Negative for musculoskeletal complaints Neurological: Negative for headache All other ROS negative  ____________________________________________   PHYSICAL EXAM:  VITAL SIGNS: ED Triage Vitals  Enc Vitals Group     BP 07/29/19 1648 (!) 99/53     Pulse Rate 07/29/19 1648 72     Resp 07/29/19 1648 16     Temp 07/29/19 1648 99.4 F (37.4 C)     Temp Source 07/29/19 1648 Oral     SpO2 07/29/19 1648 97 %     Weight 07/29/19 1649 134 lb (60.8 kg)     Height 07/29/19 1649 5\' 9"  (1.753 m)     Head Circumference --      Peak Flow --      Pain Score 07/29/19 1648 7  Pain Loc --      Pain Edu? --      Excl. in [place]? --     Constitutional: Alert and oriented. Well appearing and in no distress. Eyes: Normal exam ENT      Head: Normocephalic and atraumatic.      Mouth/Throat: Mucous membranes are moist. Cardiovascular: Normal rate, regular rhythm Respiratory: Normal respiratory effort without tachypnea nor retractions. Breath sounds are clear  Gastrointestinal: Soft and nontender. No distention.   Musculoskeletal: Nontender with normal range of motion in all extremities Neurologic:  Normal speech and language. No gross focal neurologic deficits  Skin:  Skin is warm, dry and intact.  Psychiatric: Mood and affect are  normal.   ____________________________________________   INITIAL IMPRESSION / ASSESSMENT AND PLAN / ED COURSE  Pertinent labs & imaging results that were available during my care of the patient were reviewed by me and considered in my medical decision making (see chart for details).   Patient presents to the emergency department for nausea and vomiting over the past several days.  Also states he believes is due to anxiety.  States he has anxiety and depression, denies SI or HI.  I spoke to the patient regarding speaking to a psychiatrist, patient does not wish to speak to a psychiatrist tonight but states he would be open to following up with one as an outpatient.  Patient also states since 2017 he has been off antiviral medications for his diagnosed HIV.  I spoke to the patient regarding this and the need to follow-up with ID so he can restart antivirals.  Patient states he did not like the way they made him feel but is agreeable to follow-up.  Currently patient appears well, largely reassuring vital signs lab work largely reassuring as well, thrombocytopenia appears fairly baseline for the patient.  We will IV hydrate the patient, treat nausea and continue to closely monitor.  Overall the patient appears well and anticipate likely discharge home with ID and psychiatry follow-up.  Patient agreeable to plan.  Patient's work-up is largely nonrevealing.  We will discharge with Zofran to be used if needed.  We will provide [facility] as well as ID follow-up.  Derrick Leonard was evaluated in Emergency Department on 07/29/2019 for the symptoms described in the history of present illness. He was evaluated in the context of the global COVID-19 pandemic, which necessitated consideration that the patient might be at risk for infection with the SARS-CoV-2 virus that causes COVID-19. Institutional protocols and algorithms that pertain to the evaluation of patients at risk for COVID-19 are in a state of rapid change based  on information released by regulatory bodies including the CDC and federal and state organizations. These policies and algorithms were followed during the patient's care in the ED.  ____________________________________________   FINAL CLINICAL IMPRESSION(S) / ED DIAGNOSES  Nausea vomiting Dehydration   Minna Antis, MD 07/29/19 2142

## 2019-07-29 NOTE — ED Triage Notes (Signed)
Pt to ED via [facility] for emesis. Per pt, he really hasn't been drinking much over the last few days and thinks he may be dehydrated. Pt states that he tried to drink water this morning but vomited it back up. Pt is A & O at this time in NAD.

## 2019-07-29 NOTE — Discharge Instructions (Addendum)
Please call the number provided for infectious disease to arrange a follow-up appointment to restart your antiviral medications.  Please call the number provided for [facility] to arrange a follow-up appointment.  Return to the emergency department for any fever, abdominal pain, any other symptom personally concerning to yourself.  Please take your nausea medication as needed as written and drink plenty of fluids.

## 2019-07-29 NOTE — ED Notes (Signed)
VS obtained by this RN. Pt states "this wheelchair is hurting my ass". This RN offered to move patient to a bench, pt inquiring about wait times, this RN apologized and explained delay.

## 2019-07-29 NOTE — ED Notes (Addendum)
EDP in room at this time, pt reports n/v for the past week.  Pt alert and oriented at this time, c/o feeling some weakness as well. Pt also reports some depression and anxiety.

## 2019-07-29 NOTE — ED Triage Notes (Signed)
First RN Note: pt presents to ED via [facility] with c/o emesis x 1 week and general malaise. Per EMS pt with delayed skin turgor, mild orthostatic hypotension with 14 beat increase in HR and 12 point drop in BP.   Per EMS pt is HIV positive and dx with chlamydia approx 3 weeks ago and has been taking abx for same. EMS reports pt is mildly lethargic however A&O x4.    86HR 98/55

## 2020-03-01 ENCOUNTER — Emergency Department
Admission: EM | Admit: 2020-03-01 | Discharge: 2020-03-01 | Disposition: A | Discharge status: Home / Self Care | Payer: Medicaid Other | Attending: Emergency Medicine | Admitting: Emergency Medicine

## 2020-03-01 ENCOUNTER — Other Ambulatory Visit: Payer: Self-pay

## 2020-03-01 ENCOUNTER — Encounter: Payer: Self-pay | Admitting: Emergency Medicine

## 2020-03-01 ENCOUNTER — Emergency Department: Payer: Medicaid Other

## 2020-03-01 DIAGNOSIS — B20 Human immunodeficiency virus [HIV] disease: Secondary | ICD-10-CM

## 2020-03-01 DIAGNOSIS — R0789 Other chest pain: Secondary | ICD-10-CM | POA: Diagnosis not present

## 2020-03-01 DIAGNOSIS — J45909 Unspecified asthma, uncomplicated: Secondary | ICD-10-CM | POA: Diagnosis not present

## 2020-03-01 DIAGNOSIS — R079 Chest pain, unspecified: Secondary | ICD-10-CM | POA: Diagnosis present

## 2020-03-01 DIAGNOSIS — F17210 Nicotine dependence, cigarettes, uncomplicated: Secondary | ICD-10-CM | POA: Insufficient documentation

## 2020-03-01 DIAGNOSIS — Z21 Asymptomatic human immunodeficiency virus [HIV] infection status: Secondary | ICD-10-CM | POA: Diagnosis not present

## 2020-03-01 HISTORY — DX: Asymptomatic human immunodeficiency virus (hiv) infection status: Z21

## 2020-03-01 HISTORY — DX: Human immunodeficiency virus (HIV) disease: B20

## 2020-03-01 LAB — CBC
HCT: 38.6 % — ABNORMAL LOW (ref 39.0–52.0)
Hemoglobin: 12.9 g/dL — ABNORMAL LOW (ref 13.0–17.0)
MCH: 30.8 pg (ref 26.0–34.0)
MCHC: 33.4 g/dL (ref 30.0–36.0)
MCV: 92.1 fL (ref 80.0–100.0)
Platelets: 304 10*3/uL (ref 150–400)
RBC: 4.19 MIL/uL — ABNORMAL LOW (ref 4.22–5.81)
RDW: 12.5 % (ref 11.5–15.5)
WBC: 6.5 10*3/uL (ref 4.0–10.5)
nRBC: 0 % (ref 0.0–0.2)

## 2020-03-01 LAB — BASIC METABOLIC PANEL
Anion gap: 13 (ref 5–15)
BUN: 16 mg/dL (ref 6–20)
CO2: 24 mmol/L (ref 22–32)
Calcium: 9 mg/dL (ref 8.9–10.3)
Chloride: 99 mmol/L (ref 98–111)
Creatinine, Ser: 0.93 mg/dL (ref 0.61–1.24)
GFR, Estimated: >60 mL/min (ref >60)
Glucose, Bld: 118 mg/dL — ABNORMAL HIGH (ref 70–99)
Potassium: 4 mmol/L (ref 3.5–5.1)
Sodium: 136 mmol/L (ref 135–145)

## 2020-03-01 LAB — TROPONIN I (HIGH SENSITIVITY)
Troponin I (High Sensitivity): 4 ng/L (ref <18)
Troponin I (High Sensitivity): 4 ng/L (ref <18)

## 2020-03-01 MED ORDER — ACETAMINOPHEN 500 MG PO TABS
1000.0000 mg | ORAL_TABLET | Freq: Once | ORAL | Status: AC
  Administered 2020-03-01: 1000 mg via ORAL
  Filled 2020-03-01: qty 2

## 2020-03-01 MED ORDER — KETOROLAC TROMETHAMINE 30 MG/ML IJ SOLN
30.0000 mg | Freq: Once | INTRAMUSCULAR | Status: AC
  Administered 2020-03-01: 30 mg via INTRAMUSCULAR
  Filled 2020-03-01: qty 1

## 2020-03-01 NOTE — Discharge Instructions (Signed)
Please take Tylenol and ibuprofen/Advil for your pain.  It is safe to take them together, or to alternate them every few hours.  Take up to 1000mg of Tylenol at a time, up to 4 times per day.  Do not take more than 4000 mg of Tylenol in 24 hours.  For ibuprofen, take 400-600 mg, 4-5 times per day. ° ° °

## 2020-03-01 NOTE — ED Notes (Signed)
Patient eloped prior to receiving discharge papers

## 2020-03-01 NOTE — ED Triage Notes (Signed)
Pt in via [facility] w/L chest pain that woke him up at 0700. Pain underneath L breast, radiates to L mid-axillary area, worse w/movement. Denies any n/v or sob. Pain w/palpation

## 2020-03-01 NOTE — ED Provider Notes (Signed)
[facility] Emergency Department Provider Note ____________________________________________   Event Date/Time   First MD Initiated Contact with Patient 03/01/20 [phone]     (approximate)  I have reviewed the triage vital signs and the nursing notes.  HISTORY  Chief Complaint Chest Pain   HPI Derrick Leonard is a 25 y.o. malewho presents to the ED for evaluation of asthma  Chart review indicates hx asthma and HIV on Biktarvy and follows with [facility].  Last seen as an outpatient 4 days ago.  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 Chronic right sided chest pain noted on multiple notes.   Patient presents to the ED from home with complaints of sharp left-sided chest pain started this morning when he awakened from sleep.  Patient reports he felt normal yesterday but awoke this morning at his normal time with sharp left-sided chest pain, worse with deep inspiration and movement.  He has not taken any medications prior to arrival.  Currently reporting improving pain, was 10/10 intensity, now down to 4/10 intensity without intervention. Patient reports compliance with Biktarvy, taking daily.  Cannot recall any missed doses.  I discussed the case with patient's sister, Leandro Reasoner, over the phone as she lives with the patient.  She indicates that he has been complaining of intermittent right-sided chest pain for "a long time" and that she saw him this morning complaining of sharp left-sided chest pain.  She denies any syncopal episodes, emesis or seizure-like activity.  Denies trauma or recent illnesses for the patient.   Past Medical History:  Diagnosis Date  . Asthma   . HIV (human immunodeficiency virus infection) [facility])     Patient Active Problem List   Diagnosis Date Noted  . Syphilis 07/04/2019  . Appendicitis, acute     Past Surgical History:  Procedure Laterality Date  . LAPAROSCOPIC APPENDECTOMY N/A  06/30/2015   Procedure: APPENDECTOMY LAPAROSCOPIC;  Surgeon: Leafy Ro, MD;  Location: [facility] ORS;  Service: General;  Laterality: N/A;    Prior to Admission medications   Medication Sig Start Date End Date Taking? Authorizing Provider  acyclovir (ZOVIRAX) 800 MG tablet Take 1 tablet (800 mg total) by mouth daily. 11/10/19   Matt Holmes, PA  diphenhydrAMINE (BENADRYL) 25 mg capsule Take 2 capsules (50 mg total) by mouth every 4 (four) hours as needed. 07/15/16 07/15/17  Nita Sickle, MD  doxycycline (VIBRAMYCIN) 100 MG capsule Take 1 capsule (100 mg total) by mouth 2 (two) times daily. 07/01/19   Tommi Rumps, PA-C  famotidine (PEPCID) 20 MG tablet Take 1 tablet (20 mg total) by mouth daily. 07/12/16 07/12/17  Cuthriell, Delorise Royals, PA-C  ondansetron (ZOFRAN) 4 MG tablet Take 1 tablet (4 mg total) by mouth daily as needed for nausea or vomiting. 07/29/19   Minna Antis, MD    Allergies Cheese and Amoxicillin  No family history on file.  Social History Social History   Tobacco Use  . Smoking status: Current Every Day Smoker    Packs/day: 1.00    Types: Cigarettes  . Smokeless tobacco: Never Used  Substance Use Topics  . Alcohol use: Yes    Comment: occ  . Drug use: Yes    Types: Marijuana    Comment: occasionally    Review of Systems  Constitutional: No fever/chills Eyes: No visual changes. ENT: No sore throat. Cardiovascular: Positive for chest pain Respiratory: Denies shortness of breath. Gastrointestinal: No abdominal pain.  No nausea,  no vomiting.  No diarrhea.  No constipation. Genitourinary: Negative for dysuria. Musculoskeletal: Negative for back pain. Skin: Negative for rash. Neurological: Negative for headaches, focal weakness or numbness.  ____________________________________________   PHYSICAL EXAM:  VITAL SIGNS: Vitals:   03/01/20 0830 03/01/20 0900  BP: 105/65 111/77  Pulse: 69 82  Resp: 13 (!) 26  Temp:    SpO2: 97% 98%      Constitutional: Alert and oriented. Well appearing and in no acute distress.  Flat affect.  Answers questions appropriately.  Follows commands in all 4 extremities. Eyes: Conjunctivae are normal. PERRL. EOMI. Head: Atraumatic. Nose: No congestion/rhinnorhea. Mouth/Throat: Mucous membranes are moist.  Oropharynx non-erythematous. Neck: No stridor. No cervical spine tenderness to palpation. Cardiovascular: Normal rate, regular rhythm. Grossly normal heart sounds.  Good peripheral circulation. Respiratory: Normal respiratory effort.  No retractions. Lungs CTAB. Gastrointestinal: Soft , nondistended, nontender to palpation. No CVA tenderness. Musculoskeletal: No lower extremity tenderness nor edema.  No joint effusions. No signs of acute trauma. Reproducible left-sided chest pain with light palpation without overlying skin changes or signs of trauma.  Midclavicular line at T6/7 interspace. Neurologic:  Normal speech and language. No gross focal neurologic deficits are appreciated. No gait instability noted. Cranial nerves II through XII intact 5/5 strength and sensation in all 4 extremities Skin:  Skin is warm, dry and intact. No rash noted. Psychiatric: Very flat affect, but with linear thought processes.  ____________________________________________   LABS (all labs ordered are listed, but only abnormal results are displayed)  Labs Reviewed  BASIC METABOLIC PANEL - Abnormal; Notable for the following components:      Result Value   Glucose, Bld 118 (*)    All other components within normal limits  CBC - Abnormal; Notable for the following components:   RBC 4.19 (*)    Hemoglobin 12.9 (*)    HCT 38.6 (*)    All other components within normal limits  TROPONIN I (HIGH SENSITIVITY)  TROPONIN I (HIGH SENSITIVITY)   ____________________________________________  12 Lead EKG  Sinus rhythm, rate of 79 bpm.  Normal axis and intervals.  No evidence of acute  ischemia. ____________________________________________  RADIOLOGY  ED MD interpretation: 2 view CXR reviewed by me without evidence of acute cardiopulmonary pathology.  Official radiology report(s): DG Chest 2 View  Result Date: 03/01/2020 CLINICAL DATA:  Chest pain EXAM: CHEST - 2 VIEW COMPARISON:  April 25, 2017 FINDINGS: Lungs are clear. Heart size and pulmonary vascularity are normal. No adenopathy. No pneumothorax. No bone lesions. IMPRESSION: Lungs clear.  Cardiac silhouette within normal limits. Electronically Signed   By: Bretta Bang III M.D.   On: 03/01/2020 08:11    ____________________________________________   PROCEDURES and INTERVENTIONS  Procedure(s) performed (including Critical Care):  .1-3 Lead EKG Interpretation Performed by: Delton Prairie, MD Authorized by: Delton Prairie, MD     Interpretation: normal     ECG rate:  72   ECG rate assessment: normal     Rhythm: sinus rhythm     Ectopy: none     Conduction: normal      Medications  acetaminophen (TYLENOL) tablet 1,000 mg (1,000 mg Oral Given 03/01/20 0942)  ketorolac (TORADOL) 30 MG/ML injection 30 mg (30 mg Intramuscular Given 03/01/20 0942)    ____________________________________________   MDM / ED COURSE   25 year old male with known HIV presents to the ED with atypical chest pains, without evidence of acute pathology, and amenable to outpatient management.  Normal vitals on room air.  Exam generally  reassuring without evidence of distress, neurovascular deficits or trauma.  His chest pain is reproducible on palpation, without overlying skin changes, rashes or signs of trauma.  He was provided Tylenol and Toradol with resolution of his pain.  Work-up is benign.  EKG is nonischemic and two high-sensitivity troponins are negative.  CXR demonstrates no infiltrate.  Patient medically stable for outpatient management.   Clinical Course as of 03/01/20 1037  Sat Mar 01, 2020  0913 Caleb Popp, sister,  patient stays with her. She thinks he may have had a very bad panic attack. Complaining of left sided sharp chest pains, "looks like I do when I have a panic attack." Has been complaining of pain for a few days , but on the right side of his chest.  [DS]  1036 Reassessed.  Patient reports that he is chest pain-free.  Father now at the bedside and we discussed benign work-up.  We discussed return precautions for the ED.  We discussed adherence to [facility]. [DS]    Clinical Course User Index [DS] Delton Prairie, MD    ____________________________________________   FINAL CLINICAL IMPRESSION(S) / ED DIAGNOSES  Final diagnoses:  Other chest pain  Currently asymptomatic HIV infection, with history of HIV-related illness [facility])     ED Discharge Orders    None       Janus Vlcek   Note:  This document was prepared using Dragon voice recognition software and may include unintentional dictation errors.   Delton Prairie, MD 03/01/20 1039

## 2020-03-14 ENCOUNTER — Ambulatory Visit: Payer: Medicaid Other | Attending: Psychiatry | Admitting: Speech Pathology

## 2020-03-14 ENCOUNTER — Other Ambulatory Visit: Payer: Self-pay

## 2020-03-14 ENCOUNTER — Telehealth: Payer: Self-pay | Admitting: Speech Pathology

## 2020-03-14 NOTE — Telephone Encounter (Signed)
I contacted Marcelino Duster (pt's mother) who has been point of contact with all scheduling. She stated that she thought his appt was next Friday - not today. Appt times offered for next week with evaluation rescheduled for Tuesday, March 1st at 1 pm.   Darcie Mellone B. Dreama Saa M.S., CCC-SLP, [facility] Speech-Language Pathologist Rehabilitation Services Office [phone]

## 2020-03-18 ENCOUNTER — Other Ambulatory Visit: Payer: Self-pay

## 2020-03-18 ENCOUNTER — Ambulatory Visit: Payer: Medicaid Other | Attending: Psychiatry | Admitting: Speech Pathology

## 2020-03-18 DIAGNOSIS — R41841 Cognitive communication deficit: Secondary | ICD-10-CM | POA: Insufficient documentation

## 2020-03-19 ENCOUNTER — Encounter: Payer: Self-pay | Admitting: Speech Pathology

## 2020-03-19 NOTE — Therapy (Signed)
[facility] MAIN [facility] SERVICES [address] [place], [place], [address] Phone: [phone]   Fax:  [phone]  Speech Language Pathology Evaluation  Patient Details  Name: Derrick Leonard MRN: [id number] Date of Birth: [birth year] Referring Provider (SLP): Raquel Sarna   Encounter Date: 03/18/2020   End of Session - 03/19/20 0801    Visit Number 1    Number of Visits 25    Date for SLP Re-Evaluation 06/11/20    Authorization Type Medicaid    Authorization Time Period 03/19/2020 thru 06/11/2020    Authorization - Visit Number 1    Progress Note Due on Visit 10    SLP Start Time 1300    SLP Stop Time  1400    SLP Time Calculation (min) 60 min    Activity Tolerance Patient tolerated treatment well           Past Medical History:  Diagnosis Date  . Asthma   . HIV (human immunodeficiency virus infection) (HCC)     Past Surgical History:  Procedure Laterality Date  . LAPAROSCOPIC APPENDECTOMY N/A 06/30/2015   Procedure: APPENDECTOMY LAPAROSCOPIC;  Surgeon: Leafy Ro, MD;  Location: [facility] ORS;  Service: General;  Laterality: N/A;    There were no vitals filed for this visit.   Subjective Assessment - 03/19/20 0749    Subjective pt accompanied by his mother - history obtained from mother    Patient is accompained by: Family member    Currently in Pain? 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 Most recent MRI revealed several irregular and predominantly peripherally-enhancing lesions are noted throughout the bilateral basal  ganglia and subcortical white matter within the left frontal and left occipital lobes.    Behavioral/Cognition flat, nonverbal, starring at speaker      Prior Functional Status   Cognitive/Linguistic Baseline Information not available    Type of Home House     Lives With Family    Available Support Family    Education 10th grade    Vocation On disability      Cognition   Overall Cognitive Status Difficult to assess    Difficult to assess due to Impaired communication      Auditory Comprehension   Overall Auditory Comprehension Appears within functional limits for tasks assessed      Visual Recognition/Discrimination   Discrimination Within Function Limits      Reading Comprehension   Reading Status Impaired    Word level 76-100% accurate    Sentence Level 26-50% accurate    Paragraph Level Not tested    Functional Environmental (signs, name badge) Within functional limits    Interfering Components Processing time      Expression   Primary Mode of Expression Other (comment)   pt's perferred method of communication is verbal, however pt is largely nonverbal.     Verbal Expression   Overall Verbal Expression Impaired    Initiation Impaired  Level of Generative/Spontaneous Verbalization --   nonverbal   Repetition Impaired    Level of Impairment Word level    Naming Impairment    Responsive --   nonverbal   Confrontation --   nonverbal   Convergent --   nonverbal   Divergent --   nonverbal   Pragmatics Impairment    Impairments Abnormal affect;Interpretation of nonverbal communication   continuous starring at the speaker   Effective Techniques --   multiple techniques attempted, none were effective   Non-Verbal Means of Communication --   absent     Written Expression   Dominant Hand Right    Written Expression Within Functional Limits      Oral Motor/Sensory Function   Overall Oral Motor/Sensory Function Appears within functional limits for tasks assessed       Motor Speech   Overall Motor Speech Impaired    Respiration Within functional limits    Phonation --   nonverbal   Articulation Impaired    Level of Impairment --   isolated consonants and vowels   Intelligibility --   non verbal   Motor Planning Impaired    Level of Impairment --   consonants/vowels   Motor Speech Errors Consistent;Aware   nonverbal   Phonation Impaired    Vocal Abuses --   nonverbal                          SLP Education - 03/19/20 0800    Education Details ST POC, TEXT to SPEECH app    Person(s) Educated Patient;Parent(s)    Methods Explanation;Demonstration;Verbal cues;Handout    Comprehension Need further instruction            SLP Short Term Goals - 03/19/20 5631      SLP SHORT TERM GOAL #1   Title With maximal multi-modal cues, pt will imitate vowels in 5 of 10 opportunities.    Baseline nonverbal    Time 10    Period --   sessions   Status New      SLP SHORT TERM GOAL #2   Title With maximal cues, pt will initiate a multi-modal response within 1 minute in 5 out of 10 opportunities.    Baseline no response after > 2 minutes    Time 10    Period --   sessions   Status New            SLP Long Term Goals - 03/19/20 4970      SLP LONG TERM GOAL #1   Title Pt will use multi-modal means of communication to communication basic wants and needs.    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 As such, pt presents to evaluation largely nonverbal and no attempt to communicate via gestures despite maximal cues. While pt's oral motor abilities appear intact, pt stares at speaker for > 2 minutes with no initiation to respond. His receptive language abilities appear more functional as he can  follow verbal instruction and answer questions accurately via text.  Written expression is intact as well as reading to the phrase level. SLP introduced a TEXT TO SPEECH app  and pt demonstrated good stimulability with use. Given the severity of pt's deficits, his communication is dependent on others, his leisure activities are limited to pacing at home and is overall functional independence is nonexistent at this time. Given the results of this evaluation, pt requires skilled Outpatient ST intervention to focus on cognitive communication deficits to increase functional independence and reduce caregiver burden. Pt and his mother were education on results/recommendations with all questions answered to his mother's satisfaction.       Speech Therapy Frequency 2x / week    Duration 12 weeks    Treatment/Interventions Language facilitation;Compensatory techniques;Internal/external aids;SLP instruction and feedback;Multimodal communication approach;Cognitive reorganization;Environmental controls;Functional tasks;Patient/family education;Compensatory strategies    Potential to Achieve Goals Good    Potential Considerations Severity of impairments    SLP Home Exercise Plan download text to speech app    Consulted and Agree with Plan of Care Patient;Family member/caregiver    Family Member Consulted pt's mother           Patient will benefit from skilled therapeutic intervention in order to improve the following deficits and impairments:   Cognitive communication deficit    Problem List Patient Active Problem List   Diagnosis Date Noted  . Syphilis 07/04/2019  . Appendicitis, acute    Ferry Matthis B. Dreama Saa M.S., CCC-SLP, [facility] Speech-Language Pathologist Rehabilitation Services Office [phone]  Reuel Derby 03/19/2020, 8:24 AM  [facility] MAIN [facility] SERVICES [address] [place], [place], [address] Phone: [phone]   Fax:  [phone]  Name:  KENTAVIOUS MICHELE MRN: [id number] Date of Birth: [birth year]

## 2020-03-19 NOTE — Patient Instructions (Signed)
Download and use a text to speech app on his phone Attempt humming to favorite songs

## 2020-03-21 ENCOUNTER — Ambulatory Visit: Payer: Medicaid Other | Admitting: Speech Pathology

## 2020-03-25 ENCOUNTER — Encounter: Payer: Self-pay | Admitting: Speech Pathology

## 2020-03-27 ENCOUNTER — Encounter: Payer: Self-pay | Admitting: Speech Pathology

## 2020-04-01 ENCOUNTER — Encounter: Payer: Self-pay | Admitting: Speech Pathology

## 2020-04-03 ENCOUNTER — Encounter: Payer: Self-pay | Admitting: Speech Pathology

## 2020-04-08 ENCOUNTER — Encounter: Payer: Self-pay | Admitting: Speech Pathology

## 2020-04-10 ENCOUNTER — Encounter: Payer: Self-pay | Admitting: Speech Pathology

## 2020-04-12 ENCOUNTER — Other Ambulatory Visit: Payer: Self-pay

## 2020-04-12 ENCOUNTER — Ambulatory Visit
Admission: EM | Admit: 2020-04-12 | Discharge: 2020-04-12 | Disposition: A | Discharge status: Home / Self Care | Payer: Medicaid Other | Attending: Family Medicine | Admitting: Family Medicine

## 2020-04-12 ENCOUNTER — Encounter: Payer: Self-pay | Admitting: Gynecology

## 2020-04-12 DIAGNOSIS — A630 Anogenital (venereal) warts: Secondary | ICD-10-CM | POA: Diagnosis not present

## 2020-04-12 DIAGNOSIS — A601 Herpesviral infection of perianal skin and rectum: Secondary | ICD-10-CM | POA: Diagnosis not present

## 2020-04-12 MED ORDER — VALACYCLOVIR HCL 500 MG PO TABS: 500.0000 mg | ORAL_TABLET | Freq: Two times a day (BID) | ORAL | 1 refills | Status: DC

## 2020-04-12 MED ORDER — VALACYCLOVIR HCL 1 G PO TABS: 1000.0000 mg | ORAL_TABLET | Freq: Two times a day (BID) | ORAL | 0 refills | Status: DC

## 2020-04-12 NOTE — ED Triage Notes (Signed)
Patient c/o rectal pain Derrick Leonard

## 2020-04-12 NOTE — Discharge Instructions (Signed)
Medication as prescribed.  Follow up with your physician.  Rest, ice, tylenol for the foot and ankle pain.   Take care  Dr. Adriana Simas

## 2020-04-13 NOTE — ED Provider Notes (Signed)
[facility]    CSN: [id number] Arrival date & time: 04/12/20  1554      History   Chief Complaint Chief Complaint  Patient presents with  . Rectal Pain   HPI  25 year old male presents with the above complaint.  Patient has a complicated past medical history (see below).  Patient has had ongoing rectal pain. Recently diagnosed with anal condyloma/warts. Currently using topical therapy.  His rectal pain is worsening.  He is having difficulty sitting down.  Additionally, he reports ongoing right ankle and foot pain.  This has been going on for approximately 1.5 weeks.  No fall, trauma, injury.  No relieving factors.  Mother states that he is constantly on his feet as he does not want to sit down.  She thinks that this is a contributing factor.  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 Syphilis 07/04/2019  . Appendicitis, acute     Past Surgical History:  Procedure Laterality Date  . LAPAROSCOPIC APPENDECTOMY N/A 06/30/2015   Procedure: APPENDECTOMY LAPAROSCOPIC;  Surgeon: Leafy Ro, MD;  Location: [facility] ORS;  Service: General;  Laterality: N/A;       Home Medications    Prior to Admission medications   Medication Sig Start Date End Date Taking? Authorizing Provider  acyclovir (ZOVIRAX) 800 MG tablet Take 1 tablet (800 mg total) by mouth daily. 11/10/19  Yes Hampton, Marylynn Pearson, PA  bictegravir-emtricitabine-tenofovir AF (BIKTARVY) 50-200-25 MG TABS tablet Take 1 tablet by mouth daily. 12/12/19  Yes  [provider]  carbidopa-levodopa (SINEMET IR) 25-100 MG tablet Take by mouth. 03/06/20 03/06/21 Yes [provider]  OLANZapine (ZYPREXA) 5 MG tablet  12/21/19  Yes [provider]  valACYclovir (VALTREX) 1000 MG tablet Take 1 tablet (1,000 mg total) by mouth 2 (two) times daily. 04/12/20  Yes Maryelizabeth Eberle G, DO  valACYclovir (VALTREX) 500 MG tablet Take 1 tablet (500 mg total) by mouth 2 (two) times daily. Start after initial course. Suppressive therapy. 04/12/20  Yes Nelani Schmelzle G, DO  diphenhydrAMINE (BENADRYL) 25 mg capsule Take 2 capsules (50 mg total) by mouth every 4 (four) hours as needed. 07/15/16 07/15/17  Nita Sickle, MD  famotidine (PEPCID) 20 MG tablet Take 1 tablet (20 mg total) by mouth daily. 07/12/16 07/12/17  Cuthriell, Delorise Royals, PA-C  sertraline (ZOLOFT) 50 MG tablet Take by mouth.    [provider]  sulfaDIAZINE 500 MG tablet Take by mouth. 04/10/20   [provider]    Family History Family History  Problem Relation Age of Onset  . Hypertension Father     Social History Social History   Tobacco Use  . Smoking status: Current Every Day Smoker    Packs/day: 1.00    Types: Cigarettes  . Smokeless tobacco: Never Used  Substance Use Topics  . Alcohol use: Yes    Comment: occ  . Drug use: Yes    Types: Marijuana    Comment: occasionally     Allergies   Cheese and Amoxicillin   Review of Systems Review of Systems Per HPI  Physical Exam Triage Vital Signs ED Triage Vitals  Enc Vitals Group     BP 04/12/20 1606 105/65     Pulse Rate 04/12/20 1606 75     Resp 04/12/20 1606 18     Temp 04/12/20 1606 98.1 F (36.7 C)     Temp Source 04/12/20 1606 Oral     SpO2 04/12/20 1606 98 %     Weight 04/12/20 1602 159 lb (72.1 kg)     Height 04/12/20 1602 5\' 9"  (1.753 m)     Head Circumference --      Peak Flow --      Pain Score 04/12/20 1602 10     Pain Loc --      Pain Edu? --      Excl. in GC? --    Updated  Vital Signs BP 105/65 (BP Location: Left Arm)   Pulse 75   Temp 98.1 F (36.7 C) (Oral)   Resp 18   Ht 5\' 9"  (1.753 m)   Wt 72.1 kg   SpO2 98%   BMI 23.48 kg/m   Visual Acuity Right Eye Distance:   Left Eye Distance:   Bilateral Distance:    Right Eye Near:   Left Eye Near:    Bilateral Near:     Physical Exam Constitutional:      General: He is not in acute distress. HENT:     Head: Normocephalic and atraumatic.  Eyes:     General:        Right eye: No discharge.        Left eye: No discharge.     Conjunctiva/sclera: Conjunctivae normal.  Pulmonary:     Effort: Pulmonary effort is normal. No respiratory distress.  Genitourinary:    Comments: Perianal region and perineum with numerous exophytic warts.  There are also some vesicles.  Suspect genital herpes. Musculoskeletal:     Comments: Right foot and ankle -normal range of motion.  No bruising.  No discrete areas of tenderness.   Neurological:     Mental Status: He is alert.  Psychiatric:     Comments: Flat affect.    UC Treatments / Results  Labs (all labs ordered are listed, but only abnormal results are displayed) Labs Reviewed - No data to display  EKG   Radiology No results found.  Procedures Procedures (including critical care time)  Medications Ordered in UC Medications - No data to display  Initial Impression / Assessment and Plan / UC Course  I have reviewed the triage vital signs and the nursing notes.  Pertinent labs & imaging results that were available during my care of the patient were reviewed by me and considered in my medical decision making (see chart for details).    25 year old male presents with rectal pain and right foot and ankle pain.  His rectal exam is notable for condyloma.  He also appears to have vesicles which is consistent with genital herpes.  Placing on Valtrex.  Regarding his foot and ankle pain, his exam is benign.  I discussed imaging and mother elected to proceed  with symptomatic care.  Tylenol as needed.  Rest, ice.  Supportive care.  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  Follow up with your physician.  Rest, ice, tylenol for the foot and ankle pain.   Take care  Dr.    ED  Prescriptions    Medication Sig Dispense Auth. Provider   valACYclovir (VALTREX) 1000 MG tablet Take 1 tablet (1,000 mg total) by mouth 2 (two) times daily. 20 tablet Kenetha Cozza G, DO   valACYclovir (VALTREX) 500 MG tablet Take 1 tablet (500 mg total) by mouth 2 (two) times daily. Start after initial course. Suppressive therapy. 180 tablet Tommie Sams, DO     PDMP not reviewed this encounter.   Everlene Other [place], [place] 04/13/20 [phone]

## 2020-04-15 ENCOUNTER — Encounter: Payer: Self-pay | Admitting: Speech Pathology

## 2020-04-17 ENCOUNTER — Encounter: Payer: Self-pay | Admitting: Speech Pathology

## 2020-04-18 ENCOUNTER — Other Ambulatory Visit: Payer: Self-pay

## 2020-04-18 ENCOUNTER — Ambulatory Visit
Admission: EM | Admit: 2020-04-18 | Discharge: 2020-04-18 | Disposition: A | Discharge status: Home / Self Care | Payer: Medicaid Other | Attending: Sports Medicine | Admitting: Sports Medicine

## 2020-04-18 ENCOUNTER — Encounter: Payer: Self-pay | Admitting: Emergency Medicine

## 2020-04-18 DIAGNOSIS — M7989 Other specified soft tissue disorders: Secondary | ICD-10-CM | POA: Diagnosis not present

## 2020-04-18 DIAGNOSIS — B20 Human immunodeficiency virus [HIV] disease: Secondary | ICD-10-CM

## 2020-04-18 DIAGNOSIS — B582 Toxoplasma meningoencephalitis: Secondary | ICD-10-CM | POA: Diagnosis not present

## 2020-04-18 HISTORY — DX: Toxoplasma meningoencephalitis: B58.2

## 2020-04-18 NOTE — ED Provider Notes (Signed)
[facility]    CSN: [id number] Arrival date & time: 04/18/20  1547      History   Chief Complaint Chief Complaint  Patient presents with  . Hand Pain    HPI Derrick Leonard is a 25 y.o. male.   25 year old male who presents with his mother for evaluation of bilateral hand swelling.  He has a significant past medical history of HIV being followed by infectious disease in [place].  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  Mom reports that he is verbal but he did not speak to me.  History was obtained directly from mom.  No trauma to his hands.  Mom is concerned because his hands are swelling.  There is no history of any other complaints including upper respiratory complaints chest pain, shortness of breath, abdominal pain, urinary symptoms, or any other concerning new issues.  Please see his chart for his past medical history.  He has been seen on multiple occasions over the past several months.  [facility]ally [facility] on [address] in [place] for [place] ongoing medical care.  No red flag signs or symptoms listed on history.     Past Medical History:  Diagnosis Date  . Asthma   . CNS toxoplasmosis (HCC)   . HIV (human immunodeficiency virus infection) [facility](HCC)     Patient Active Problem List   Diagnosis Date Noted  . Bilateral hand swelling 04/18/2020  . Syphilis 07/04/2019  . Appendicitis, acute     Past Surgical History:  Procedure Laterality Date  . LAPAROSCOPIC APPENDECTOMY N/A 06/30/2015   Procedure: APPENDECTOMY LAPAROSCOPIC;  Surgeon: Leafy Roiego F Pabon, MD;  Location: [facility] ORS;  Service: General;  Laterality: N/A;       Home Medications    Prior to Admission medications   Medication Sig Start Date End Date Taking? Authorizing Provider  bictegravir-emtricitabine-tenofovir AF (BIKTARVY) 50-200-25 MG TABS tablet Take 1 tablet by mouth daily. 12/12/19  Yes  [provider]  carbidopa-levodopa (SINEMET IR) 25-100 MG tablet Take by mouth. 03/06/20 03/06/21 Yes [provider]  diphenhydrAMINE (BENADRYL) 25 mg capsule Take 2 capsules (50 mg total) by mouth every 4 (four) hours as needed. 07/15/16 07/15/17 Yes Veronese, [place], MD  famotidine (PEPCID) 20 MG tablet Take 1 tablet (20 mg total) by mouth daily. 07/12/16 07/12/17 Yes Cuthriell, Delorise RoyalsJonathan D, PA-C  OLANZapine (ZYPREXA) 5 MG tablet  12/21/19  Yes [provider]  sertraline (ZOLOFT) 50 MG tablet Take by mouth.   Yes [provider]  sulfaDIAZINE 500 MG tablet Take by mouth. 04/10/20  Yes [provider]  valACYclovir (VALTREX) 1000 MG tablet Take 1 tablet (1,000 mg total) by mouth 2 (two) times daily. 04/12/20  Yes Cook, Jayce G, DO  acyclovir (ZOVIRAX) 800 MG tablet Take 1 tablet (800 mg total) by mouth daily. 11/10/19   Matt HolmesHampton, Carla J, PA  valACYclovir (VALTREX) 500 MG tablet Take 1 tablet (500 mg total) by mouth 2 (two) times daily. Start after initial course. Suppressive therapy. 04/12/20   Tommie Samsook, Jayce G, DO    Family History Family History  Problem Relation Age of Onset  . Hypertension Father     Social History Social History   Tobacco Use  . Smoking status: Current Every Day Smoker    Packs/day: 1.00    Types: Cigarettes  . Smokeless tobacco: Never Used  Substance Use Topics  . Alcohol use: Yes  Comment: occ  . Drug use: Yes    Types: Marijuana    Comment: occasionally     Allergies   Cheese and Amoxicillin   Review of Systems Review of Systems  Constitutional: Negative.  Negative for activity change, appetite change, chills, diaphoresis, fatigue and fever.  HENT: Negative.  Negative for congestion, ear pain, postnasal drip, rhinorrhea, sinus pressure, sinus pain and sneezing.   Eyes: Negative.   Respiratory: Negative.  Negative for cough, chest tightness, shortness of breath, wheezing and stridor.   Cardiovascular:  Negative.  Negative for chest pain and palpitations.       Swelling in bilateral hands.  Gastrointestinal: Negative.  Negative for abdominal pain.  Genitourinary: Negative.  Negative for dysuria and flank pain.  Musculoskeletal: Positive for joint swelling. Negative for arthralgias, back pain, gait problem, myalgias, neck pain and neck stiffness.  Skin: Negative for color change, pallor, rash and wound.  Neurological:       No changes in neurological status from baseline.  All other systems reviewed and are negative.    Physical Exam Triage Vital Signs ED Triage Vitals  Enc Vitals Group     BP 04/18/20 1617 (!) 110/57     Pulse Rate 04/18/20 1617 67     Resp 04/18/20 1617 18     Temp 04/18/20 1617 98.2 F (36.8 C)     Temp Source 04/18/20 1617 Oral     SpO2 04/18/20 1617 99 %     Weight 04/18/20 1612 158 lb 15.2 oz (72.1 kg)     Height 04/18/20 1612 5\' 9"  (1.753 m)     Head Circumference --      Peak Flow --      Pain Score --      Pain Loc --      Pain Edu? --      Excl. in GC? --    No data found.  Updated Vital Signs BP (!) 110/57 (BP Location: Left Arm)   Pulse 67   Temp 98.2 F (36.8 C) (Oral)   Resp 18   Ht 5\' 9"  (1.753 m)   Wt 72.1 kg   SpO2 99%   BMI 23.47 kg/m   Visual Acuity Right Eye Distance:   Left Eye Distance:   Bilateral Distance:    Right Eye Near:   Left Eye Near:    Bilateral Near:     Physical Exam Vitals and nursing note reviewed.  Constitutional:      General: He is not in acute distress.    Appearance: He is not ill-appearing, toxic-appearing or diaphoretic.  HENT:     Head: Normocephalic and atraumatic.     Nose: Nose normal. No congestion or rhinorrhea.     Mouth/Throat:     Mouth: Mucous membranes are moist.     Pharynx: No oropharyngeal exudate or posterior oropharyngeal erythema.  Eyes:     General: No scleral icterus.       Right eye: No discharge.        Left eye: No discharge.     Extraocular Movements: Extraocular  movements intact.     Conjunctiva/sclera: Conjunctivae normal.     Pupils: Pupils are equal, round, and reactive to light.  Cardiovascular:     Rate and Rhythm: Normal rate and regular rhythm.     Pulses: Normal pulses.     Heart sounds: Normal heart sounds. No murmur heard. No friction rub. No gallop.   Pulmonary:     Effort: Pulmonary effort  is normal. No respiratory distress.     Breath sounds: Normal breath sounds. No stridor. No wheezing, rhonchi or rales.  Abdominal:     Palpations: Abdomen is soft.     Tenderness: There is no abdominal tenderness.  Musculoskeletal:        General: Swelling present. No tenderness, deformity or signs of injury.     Right lower leg: No edema.     Left lower leg: No edema.     Comments: Bilateral hands reveals some edema.  There is no tenderness palpation.  He has full range of motion.  Throughout the entire history and physical exam he is holding his hands in a dependent position below the level of the heart and really is not moving them.  This edema is consistent with dependent edema from lack of use.  Skin:    General: Skin is warm and dry.     Capillary Refill: Capillary refill takes less than 2 seconds.     Coloration: Skin is not jaundiced.     Findings: No bruising, erythema, lesion or rash.  Neurological:     Mental Status: He is alert. Mental status is at baseline.     Comments: Slow purposeful gait pattern consistent with parkinsonian movements.  Psychiatric:        Mood and Affect: Affect is labile and flat.        Speech: He is noncommunicative.        Behavior: Behavior is withdrawn.      UC Treatments / Results  Labs (all labs ordered are listed, but only abnormal results are displayed) Labs Reviewed - No data to display  EKG   Radiology No results found.  Procedures Procedures (including critical care time)  Medications Ordered in UC Medications - No data to display  Initial Impression / Assessment and Plan / UC  Course  I have reviewed the triage vital signs and the nursing notes.  Pertinent labs & imaging results that were available during my care of the patient were reviewed by me and considered in my medical decision making (see chart for details).  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  Treatment plan: 1.  The findings and treatment plan were discussed in detail with his mother.  She was in agreement. 2.  Given that there was no trauma I did not feel the need to x-ray.  I had a long discussion with her that in my professional opinion this was dependent edema from the fact that he is not using his upper extremities and that he is holding them in one place below the level of his heart.  I encouraged him to move around and have encouraged her to get him a stress ball and show him how to use it. 3.  I encouraged him to get in with a primary care provider to see if there was anything further and that could be done in an outpatient setting in terms of work-up. 4.  Educational handout was provided. 5.  Certainly if you worsen in any way they should take him to the emergency room otherwise we will see him back as needed.    Final Clinical Impressions(s) / UC Diagnoses   Final diagnoses:  Bilateral hand swelling  CNS toxoplasmosis (HCC)  Human immunodeficiency virus (HIV) disease (HCC)     Discharge Instructions     Since there is no injury I do not feel the need to x-ray today.  In addition he does not seem to be in any pain.  I have reviewed his labs which were done recently.does not show any significant liver or kidney issue that would cause him to retain fluid.  My working diagnosis would be that he is just not using his  extremities and often he is developing peripheral edema or swelling in his extremities.  I would encourage you to get him to move around.  You can also purchase a stress ball at the dollar store and just have him use that to try to mobilize some of the fluid. I would encourage you to get in with his primary care provider to do a more extensive work-up that can be done in the outpatient setting and not in an urgent care or emergency room.  If his symptoms were to worsen in any way and you were concerned please take him to the emergency room.  Otherwise we will see him back as needed.    ED Prescriptions    None     PDMP not reviewed this encounter.   Delton See, MD 04/18/20 1723

## 2020-04-18 NOTE — ED Triage Notes (Addendum)
Pt c/o right hand pain, decreased ROM and swelling. Started yesterday. Denies injury. Bilateral hand swelling.

## 2020-04-18 NOTE — Discharge Instructions (Addendum)
Since there is no injury I do not feel the need to x-ray today.  In addition he does not seem to be in any pain.  I have reviewed his labs which were done recently.does not show any significant liver or kidney issue that would cause him to retain fluid.  My working diagnosis would be that he is just not using his extremities and often he is developing peripheral edema or swelling in his extremities.  I would encourage you to get him to move around.  You can also purchase a stress ball at the dollar store and just have him use that to try to mobilize some of the fluid. I would encourage you to get in with his primary care provider to do a more extensive work-up that can be done in the outpatient setting and not in an urgent care or emergency room.  If his symptoms were to worsen in any way and you were concerned please take him to the emergency room.  Otherwise we will see him back as needed.

## 2022-03-16 ENCOUNTER — Encounter (HOSPITAL_COMMUNITY): Payer: Self-pay

## 2022-03-16 ENCOUNTER — Other Ambulatory Visit: Payer: Self-pay

## 2022-03-16 ENCOUNTER — Observation Stay
Admission: EM | Admit: 2022-03-16 | Discharge: 2022-03-17 | Disposition: A | Discharge status: Home / Self Care | Payer: Medicare Other | Attending: Internal Medicine | Admitting: Internal Medicine

## 2022-03-16 ENCOUNTER — Emergency Department: Payer: Medicare Other

## 2022-03-16 ENCOUNTER — Encounter: Payer: Self-pay | Admitting: Family Medicine

## 2022-03-16 DIAGNOSIS — G20C Parkinsonism, unspecified: Secondary | ICD-10-CM | POA: Diagnosis not present

## 2022-03-16 DIAGNOSIS — J45909 Unspecified asthma, uncomplicated: Secondary | ICD-10-CM | POA: Insufficient documentation

## 2022-03-16 DIAGNOSIS — G629 Polyneuropathy, unspecified: Secondary | ICD-10-CM

## 2022-03-16 DIAGNOSIS — Z79899 Other long term (current) drug therapy: Secondary | ICD-10-CM | POA: Diagnosis not present

## 2022-03-16 DIAGNOSIS — F17210 Nicotine dependence, cigarettes, uncomplicated: Secondary | ICD-10-CM | POA: Diagnosis not present

## 2022-03-16 DIAGNOSIS — B20 Human immunodeficiency virus [HIV] disease: Secondary | ICD-10-CM | POA: Diagnosis not present

## 2022-03-16 DIAGNOSIS — R419 Unspecified symptoms and signs involving cognitive functions and awareness: Secondary | ICD-10-CM | POA: Diagnosis not present

## 2022-03-16 DIAGNOSIS — R569 Unspecified convulsions: Secondary | ICD-10-CM

## 2022-03-16 LAB — CBC WITH DIFFERENTIAL/PLATELET
Abs Immature Granulocytes: 0.02 10*3/uL (ref 0.00–0.07)
Basophils Absolute: 0 10*3/uL (ref 0.0–0.1)
Basophils Relative: 1 %
Eosinophils Absolute: 0.2 10*3/uL (ref 0.0–0.5)
Eosinophils Relative: 3 %
HCT: 43.7 % (ref 39.0–52.0)
Hemoglobin: 14.8 g/dL (ref 13.0–17.0)
Immature Granulocytes: 0 %
Lymphocytes Relative: 15 %
Lymphs Abs: 0.9 10*3/uL (ref 0.7–4.0)
MCH: 31.8 pg (ref 26.0–34.0)
MCHC: 33.9 g/dL (ref 30.0–36.0)
MCV: 94 fL (ref 80.0–100.0)
Monocytes Absolute: 0.7 10*3/uL (ref 0.1–1.0)
Monocytes Relative: 12 %
Neutro Abs: 4.1 10*3/uL (ref 1.7–7.7)
Neutrophils Relative %: 69 %
Platelets: 141 10*3/uL — ABNORMAL LOW (ref 150–400)
RBC: 4.65 MIL/uL (ref 4.22–5.81)
RDW: 12 % (ref 11.5–15.5)
WBC: 5.9 10*3/uL (ref 4.0–10.5)
nRBC: 0 % (ref 0.0–0.2)

## 2022-03-16 LAB — COMPREHENSIVE METABOLIC PANEL
ALT: 16 U/L (ref 0–44)
AST: 19 U/L (ref 15–41)
Albumin: 4.4 g/dL (ref 3.5–5.0)
Alkaline Phosphatase: 66 U/L (ref 38–126)
Anion gap: 8 (ref 5–15)
BUN: 19 mg/dL (ref 6–20)
CO2: 26 mmol/L (ref 22–32)
Calcium: 9.1 mg/dL (ref 8.9–10.3)
Chloride: 103 mmol/L (ref 98–111)
Creatinine, Ser: 1.17 mg/dL (ref 0.61–1.24)
GFR, Estimated: >60 mL/min (ref >60)
Glucose, Bld: 129 mg/dL — ABNORMAL HIGH (ref 70–99)
Potassium: 3.5 mmol/L (ref 3.5–5.1)
Sodium: 137 mmol/L (ref 135–145)
Total Bilirubin: 0.8 mg/dL (ref 0.3–1.2)
Total Protein: 7.2 g/dL (ref 6.5–8.1)

## 2022-03-16 LAB — URINALYSIS, ROUTINE W REFLEX MICROSCOPIC
Bilirubin Urine: NEGATIVE
Glucose, UA: NEGATIVE mg/dL
Hgb urine dipstick: NEGATIVE
Ketones, ur: NEGATIVE mg/dL
Leukocytes,Ua: NEGATIVE
Nitrite: NEGATIVE
Protein, ur: NEGATIVE mg/dL
Specific Gravity, Urine: 1.028 (ref 1.005–1.030)
pH: 5 (ref 5.0–8.0)

## 2022-03-16 LAB — URINE DRUG SCREEN, QUALITATIVE (ARMC ONLY)
Amphetamines, Ur Screen: NOT DETECTED
Barbiturates, Ur Screen: NOT DETECTED
Benzodiazepine, Ur Scrn: NOT DETECTED
Cannabinoid 50 Ng, Ur ~~LOC~~: POSITIVE — AB
Cocaine Metabolite,Ur ~~LOC~~: NOT DETECTED
MDMA (Ecstasy)Ur Screen: NOT DETECTED
Methadone Scn, Ur: NOT DETECTED
Opiate, Ur Screen: NOT DETECTED
Phencyclidine (PCP) Ur S: NOT DETECTED
Tricyclic, Ur Screen: NOT DETECTED

## 2022-03-16 LAB — CK: Total CK: 100 U/L (ref 49–397)

## 2022-03-16 LAB — CBG MONITORING, ED: Glucose-Capillary: 103 mg/dL — ABNORMAL HIGH (ref 70–99)

## 2022-03-16 LAB — ETHANOL: Alcohol, Ethyl (B): <10 mg/dL (ref <10)

## 2022-03-16 MED ORDER — ACETAMINOPHEN 650 MG RE SUPP: 650.0000 mg | Freq: Four times a day (QID) | RECTAL | Status: DC | PRN

## 2022-03-16 MED ORDER — KETOROLAC TROMETHAMINE 15 MG/ML IJ SOLN
15.0000 mg | Freq: Three times a day (TID) | INTRAMUSCULAR | Status: DC | PRN
  Administered 2022-03-16: 15 mg via INTRAVENOUS
  Filled 2022-03-16: qty 1

## 2022-03-16 MED ORDER — NONFORMULARY OR COMPOUNDED ITEM
20.0000 mL | Freq: Three times a day (TID) | Status: DC
  Administered 2022-03-17 (×2): 20 mL via ORAL
  Filled 2022-03-16 (×2): qty 1

## 2022-03-16 MED ORDER — GADOBUTROL 1 MMOL/ML IV SOLN
7.0000 mL | Freq: Once | INTRAVENOUS | Status: AC | PRN
  Administered 2022-03-16: 7 mL via INTRAVENOUS

## 2022-03-16 MED ORDER — TRAZODONE HCL 50 MG PO TABS: 25.0000 mg | ORAL_TABLET | Freq: Every evening | ORAL | Status: DC | PRN

## 2022-03-16 MED ORDER — SODIUM CHLORIDE 0.9 % IV SOLN: INTRAVENOUS | Status: DC

## 2022-03-16 MED ORDER — ENOXAPARIN SODIUM 40 MG/0.4ML IJ SOSY
40.0000 mg | PREFILLED_SYRINGE | INTRAMUSCULAR | Status: DC
  Administered 2022-03-16: 40 mg via SUBCUTANEOUS
  Filled 2022-03-16: qty 0.4

## 2022-03-16 MED ORDER — SULFAMETHOXAZOLE-TRIMETHOPRIM 200-40 MG/5ML PO SUSP
20.0000 mL | Freq: Every day | ORAL | Status: DC
  Administered 2022-03-16 – 2022-03-17 (×2): 20 mL via ORAL
  Filled 2022-03-16 (×2): qty 20

## 2022-03-16 MED ORDER — LEVETIRACETAM IN NACL 500 MG/100ML IV SOLN
500.0000 mg | Freq: Two times a day (BID) | INTRAVENOUS | Status: DC
  Administered 2022-03-16 – 2022-03-17 (×2): 500 mg via INTRAVENOUS
  Filled 2022-03-16 (×4): qty 100

## 2022-03-16 MED ORDER — BICTEGRAVIR-EMTRICITAB-TENOFOV 50-200-25 MG PO TABS
1.0000 | ORAL_TABLET | Freq: Every day | ORAL | Status: DC
  Administered 2022-03-17: 1 via ORAL
  Filled 2022-03-16 (×2): qty 1

## 2022-03-16 MED ORDER — LEVETIRACETAM IN NACL 1000 MG/100ML IV SOLN
1000.0000 mg | Freq: Once | INTRAVENOUS | Status: AC
  Administered 2022-03-16: 1000 mg via INTRAVENOUS
  Filled 2022-03-16: qty 100

## 2022-03-16 MED ORDER — ONDANSETRON HCL 4 MG/2ML IJ SOLN: 4.0000 mg | Freq: Four times a day (QID) | INTRAMUSCULAR | Status: DC | PRN

## 2022-03-16 MED ORDER — LORAZEPAM 2 MG/ML IJ SOLN: 1.0000 mg | INTRAMUSCULAR | Status: DC | PRN

## 2022-03-16 MED ORDER — MAGNESIUM HYDROXIDE 400 MG/5ML PO SUSP: 30.0000 mL | Freq: Every day | ORAL | Status: DC | PRN

## 2022-03-16 MED ORDER — NONFORMULARY OR COMPOUNDED ITEM
Freq: Three times a day (TID) | Status: DC
  Filled 2022-03-16: qty 20

## 2022-03-16 MED ORDER — ONDANSETRON HCL 4 MG PO TABS: 4.0000 mg | ORAL_TABLET | Freq: Four times a day (QID) | ORAL | Status: DC | PRN

## 2022-03-16 MED ORDER — ACETAMINOPHEN 325 MG PO TABS
650.0000 mg | ORAL_TABLET | Freq: Four times a day (QID) | ORAL | Status: DC | PRN
  Administered 2022-03-17: 650 mg via ORAL
  Filled 2022-03-16: qty 2

## 2022-03-16 NOTE — ED Notes (Signed)
called to [facility] per MD Ward/rep:ryan.Marland Kitchen

## 2022-03-16 NOTE — Assessment & Plan Note (Addendum)
Continue his Neurontin.

## 2022-03-16 NOTE — Consult Note (Addendum)
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 UDS positive for THC. He has been loaded with Keppra. Continues to be very somnolent, not following commands. CT Head negative for acute findings. Plan to admit for MRI Brain with and without contrast for further evaluation and EEG. Would obtain STAT EEG in the morning given improvement in exam.   Recommendations: Our recommendations are outlined below.  Diagnostic Studies : MRI brain w/wo contrast STAT EEG in the morning, can hold off on continuous EEG given improvement in exam  Medications : Keppra 1gm now  Nursing Recommendations : Maintain Euglycemia and Euthermia Neuro checks q1-2 hrs during ICU stay if critical Once stable neuro checks q 4hrs  DVT Prophylaxis : Choice of Primary Team  Disposition : Neurology will follow   ----------------------------------------------------------------------------------------------------    Metrics: [facility] Notification Time: 03/16/2022 01:58:24 Stamp Time: 03/16/2022 02:00:56 Callback Response Time: 03/16/2022 02:01:11  Primary Provider Notified of Diagnostic Impression and Management Plan on: 03/16/2022 03:14:27   CT HEAD: As Per Radiologist CT Head Showed No Acute Hemorrhage or Acute Core  Infarct    ----------------------------------------------------------------------------------------------------  Chief Complaint: fall with shaking  History of Present Illness: Patient is a 27 year old Male. Patient presenting from home accompanied by his caregiver. He was with his friends when he fell off the chair with shaking witnessed at ~2315. Patient turned pale, cyanotic and dilated pupils. Reportedly has had this before in December, neurology was not concerned for seizures at that time. Did not have a prolonged return to baseline at that time. Caregiver is concerned that there was some kind of smoking involvement with his friends, not sure what this was. He speaks with yes/no answers and short sentences at baseline.   Past Medical History: Other PMH:  HIV, CNS toxoplasmosis, parkinsonism, small fiber neuropathy  Medications:  No Anticoagulant use  No Antiplatelet use Reviewed EMR for current medications  Allergies:  Reviewed  Social History: Drug Use: Yes  Family History:  There is no family history of premature cerebrovascular disease pertinent to this consultation  ROS : 14 Points Review of Systems was performed and was negative except mentioned in HPI.  Past Surgical History: There Is No Surgical History Contributory To Today's Visit   Examination: BP(100/64), Pulse(66), 1A: Level of Consciousness - Movements to Pain + 2 1B: Ask Month and Age - Could Not Answer Either Question Correctly + 2 1C: Blink Eyes & Squeeze Hands - Performs 0 Tasks + 2 2: Test Horizontal Extraocular Movements - Normal + 0 3: Test Visual Fields - No Visual Loss + 0 4: Test Facial Palsy (Use Grimace if Obtunded) - Normal symmetry + 0 5A: Test Left Arm Motor Drift - Drift, but doesn't hit bed + 1 5B: Test Right Arm Motor Drift - Drift, but doesn't hit bed + 1 6A: Test Left Leg Motor Drift - Drift, but doesn't hit bed + 1 6B: Test  Right Leg Motor Drift - No Effort Against Gravity +  3 7: Test Limb Ataxia (FNF/Heel-Shin) - Does Not Understand + 0 8: Test Sensation - Normal; No sensory loss + 0 9: Test Language/Aphasia - Mute/Global Aphasia: No Usable Speech/Auditory Comprehension + 3 10: Test Dysarthria - Mute/Anarthric + 2 11: Test Extinction/Inattention - No abnormality + 0  NIHSS Score: 23  Spoke with : Dr. Leonides Schanz    Patient / Family was informed the Neurology Consult would occur via TeleHealth consult by way of interactive audio and video telecommunications and consented to receiving care in this manner.  Patient is being evaluated for possible acute neurologic impairment and high probability of imminent or life - threatening deterioration.I spent total of 35 minutes providing care to this patient, including time for face to face visit via telemedicine, review of medical records, imaging studies and discussion of findings with providers, the patient and / or family.   Dr Janeece Agee   [facility] For Inpatient follow-up with [facility] physician please call [facility] [phone]. This is not an outpatient service. Post hospital discharge, please contact hospital directly.  Please do not communicate with [facility] physicians via secure chat. If you have any questions, Please contact [facility]. Please call or reconsult our service if there are any clinical or diagnostic changes.

## 2022-03-16 NOTE — H&P (Signed)
Anto[place]   PATIENT NAME: Derrick Leonard    MR#:  [id number]  DATE OF BIRTH:  1995/04/16  DATE OF ADMISSION:  03/16/2022  PRIMARY CARE PHYSICIAN: [facility] [place]   Patient is coming from: Home  REQUESTING/REFERRING PHYSICIAN: Ward, Delice Bison, DO  CHIEF COMPLAINT:   Chief Complaint  Patient presents with   Seizures   Tremors    At friends  house, witnesses states he was standing, started bumping into tables and the walls then went to the floor in what they described as "Seizure" for less than 1 minute, aroused but was not normal self. Hx of Parkinsons lesions and tremors. No Hx of seizures per mother.     HISTORY OF PRESENT ILLNESS:  Derrick Leonard is a 27 y.o. 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  He was standing in start bumping into tables and walls then went to the floor and what his friends describe the seizure lasted less than a minute.  When aroused he was not back to his normal self.  He has been compliant with his antiretroviral therapy per his family who reported no previous history of seizures.  No reported fever or chills.  No nausea or vomiting or abdominal pain.  No reported chest pain, palpitations, dyspnea or cough or wheezing.  ED Course: When he came to the ER, vital signs were within normal.  Labs revealed unremarkable CMP and CBC except for mild thrombocytopenia of 141.  UA was negative.  Urine drug screen came back positive for cannabinoids.  Alcohol level was less than 10. EKG as reviewed by me : Normal sinus rhythm with a rate of 69 with borderline right axis deviation. Imaging: Noncontrasted CT scan revealed no acute  intracranial normalities.  2 view chest x-ray showed no acute cardiopulmonary disease.  Brain MRI without contrast showed remote insult to bilateral globus palidu with no acute or reversible findings.  The patient was given 1 g of IV Keppra.  Teleneurology consult was obtained and recommendation initially was for continuous EEG and therefore transfer but later teleneurology recommendation was for admission here with clinical improvement and obtaining EEG here and inpatient neurology follow-up.  The patient did not have any recurrent seizures and was arousable on responding to commands.  He will therefore be admitted to a progressive unit bed for further evaluation and management. PAST MEDICAL HISTORY:   Past Medical History:  Diagnosis Date   Asthma    CNS toxoplasmosis ([place])    HIV (human immunodeficiency virus infection) ([place])   -History of syphilis, restless leg syndrome, peripheral neuropathy and neurocognitive disorder as well as parkinsonism.  PAST SURGICAL HISTORY:   Past Surgical History:  Procedure Laterality Date   LAPAROSCOPIC APPENDECTOMY N/A 06/30/2015   Procedure: APPENDECTOMY LAPAROSCOPIC;  Surgeon: Jules Husbands, MD;  Location: [facility] ORS;  Service: General;  Laterality: N/A;    SOCIAL HISTORY:   Social History   Tobacco Use   Smoking status: Every Day    Packs/day: 1.00    Types: Cigarettes   Smokeless tobacco: Never  Substance Use Topics   Alcohol use: Yes    Comment: occ    FAMILY HISTORY:   Family History  Problem Relation Age of Onset   Hypertension Father     DRUG ALLERGIES:   Allergies  Allergen Reactions   Cheese Swelling   Amoxicillin Rash    REVIEW OF SYSTEMS:   ROS As per history of present illness. All pertinent systems were reviewed above. Constitutional, HEENT, cardiovascular, respiratory, GI, GU, musculoskeletal, neuro, psychiatric, endocrine, integumentary and hematologic systems were reviewed and are otherwise negative/unremarkable  except for positive findings mentioned above in the HPI.   MEDICATIONS AT HOME:   Prior to Admission medications   Medication Sig Start Date End Date Taking? Authorizing Provider  acyclovir (ZOVIRAX) 800 MG tablet Take 1 tablet (800 mg total) by mouth daily. 11/10/19   Jerene Dilling, PA  bictegravir-emtricitabine-tenofovir AF (BIKTARVY) 50-200-25 MG TABS tablet Take 1 tablet by mouth daily. 12/12/19   [provider]  carbidopa-levodopa (SINEMET IR) 25-100 MG tablet Take by mouth. 03/06/20 03/06/21  [provider]  diphenhydrAMINE (BENADRYL) 25 mg capsule Take 2 capsules (50 mg total) by mouth every 4 (four) hours as needed. 07/15/16 07/15/17  Rudene Re, MD  famotidine (PEPCID) 20 MG tablet Take 1 tablet (20 mg total) by mouth daily. 07/12/16 07/12/17  Cuthriell, Charline Bills, PA-C  OLANZapine (ZYPREXA) 5 MG tablet  12/21/19   [provider]  sertraline (ZOLOFT) 50 MG tablet Take by mouth.    [provider]  sulfaDIAZINE 500 MG tablet Take by mouth. 04/10/20   [provider]  valACYclovir (VALTREX) 1000 MG tablet Take 1 tablet (1,000 mg total) by mouth 2 (two) times daily. 04/12/20   Coral Spikes, DO  valACYclovir (VALTREX) 500 MG tablet Take 1 tablet (500 mg total) by mouth 2 (two) times daily. Start after initial course. Suppressive therapy. 04/12/20   Coral Spikes, DO      VITAL SIGNS:  Blood pressure 95/63, pulse (!) 48, temperature 98.2 F (36.8 C), temperature source Axillary, resp. rate 13, height '5\' 9"'$  (1.753 m), weight 73.9 kg, SpO2 97 %.  PHYSICAL EXAMINATION:  Physical Exam  GENERAL:  27 y.o.-year-old Caucasian male patient lying in the bed with no acute distress.  He was somnolent but arousable and cooperative with exam. EYES: Pupils equal, round, reactive to light and accommodation. No scleral icterus. Extraocular muscles intact.  HEENT: Head atraumatic, normocephalic. Oropharynx and nasopharynx clear.  NECK:  Supple, no  jugular venous distention. No thyroid enlargement, no tenderness.  LUNGS: Normal breath sounds bilaterally, no wheezing, rales,rhonchi or crepitation. No use of accessory muscles of respiration.  CARDIOVASCULAR: Regular rate and rhythm, S1, S2 normal. No murmurs, rubs, or gallops.  ABDOMEN: Soft, nondistended, nontender. Bowel sounds present. No organomegaly or mass.  EXTREMITIES: No pedal edema, cyanosis, or clubbing.  NEUROLOGIC: Cranial nerves II through XII are intact.  He would not talk however.  Muscle strength 5/5 in all extremities. Sensation intact. Gait not checked.  PSYCHIATRIC: The patient is alert and oriented x 3.  Normal affect and good eye contact. SKIN: No obvious rash, lesion, or ulcer.   LABORATORY PANEL:   CBC Recent Labs  Lab 03/16/22 0024  WBC 5.9  HGB 14.8  HCT 43.7  PLT 141*   ------------------------------------------------------------------------------------------------------------------  Chemistries  Recent Labs  Lab 03/16/22 0024  NA 137  K 3.5  CL 103  CO2 26  GLUCOSE 129*  BUN 19  CREATININE 1.17  CALCIUM 9.1  AST 19  ALT 16  ALKPHOS 66  BILITOT 0.8   ------------------------------------------------------------------------------------------------------------------  Cardiac Enzymes No results for input(s): "TROPONINI" in  the last 168 hours. ------------------------------------------------------------------------------------------------------------------  RADIOLOGY:  MR Brain W and Wo Contrast  Result Date: 03/16/2022 CLINICAL DATA:  Seizure, generalized, abnormal neuro exam. History of HIV and CNS toxo. EXAM: MRI HEAD WITHOUT AND WITH CONTRAST TECHNIQUE: Multiplanar, multiecho pulse sequences of the brain and surrounding structures were obtained without and with intravenous contrast. CONTRAST:  31m GADAVIST GADOBUTROL 1 MMOL/ML IV SOLN COMPARISON:  Head CT from earlier today FINDINGS: Brain: No acute infarction, hemorrhage, hydrocephalus,  extra-axial collection or mass lesion. T2 hyperintensity with volume loss at the bilateral globus pallidus, encephalomalacia usually from prior global toxic/metabolic insult. Brain volume is normal. No abnormal enhancement. No cortical finding to correlate with seizure history. Vascular: Normal flow voids. Skull and upper cervical spine: Normal marrow signal. Sinuses/Orbits: Mild mucosal thickening asymmetrically affecting right maxillary and ethmoid sinuses. Other: Negative IMPRESSION: 1. No acute or reversible finding. 2. Remote insult to the bilateral globus pallidus. Electronically Signed   By: JJorje GuildM.D.   On: 03/16/2022 05:00   CT HEAD WO CONTRAST (5MM)  Result Date: 03/16/2022 CLINICAL DATA:  Seizure. EXAM: CT HEAD WITHOUT CONTRAST TECHNIQUE: Contiguous axial images were obtained from the base of the skull through the vertex without intravenous contrast. RADIATION DOSE REDUCTION: This exam was performed according to the departmental dose-optimization program which includes automated exposure control, adjustment of the mA and/or kV according to patient size and/or use of iterative reconstruction technique. COMPARISON:  July 26, 2014 FINDINGS: Brain: No evidence of acute infarction, hemorrhage, hydrocephalus, extra-axial collection or mass lesion/mass effect. Vascular: No hyperdense vessel or unexpected calcification. Skull: Normal. Negative for fracture or focal lesion. Sinuses/Orbits: No acute finding. Other: None. IMPRESSION: No acute intracranial pathology. Electronically Signed   By: TVirgina NorfolkM.D.   On: 03/16/2022 01:04      IMPRESSION AND PLAN:  Assessment and Plan: * Seizure ([facility] - The patient be admitted to a progressive unit bed. - We will place him on seizure precautions. - Will obtain an EEG. - We will continue him on IV Keppra and place him on as needed IV Ativan. - We will obtain a neurology consultation. - I notified Dr. KLeonel Ramsayabout the patient.  AIDS  ([place] - We will continue his antiretroviral therapy, and his prophylactic antiviral and antiprotozoal medications on adequately reconciled.  Parkinsonism - We will continue his Sinemet.  Peripheral neuropathy - We will continue his Neurontin.  Neurocognitive disorder - We will continue Zyprexa and duloxetine as well as Zoloft for associated depression.    DVT prophylaxis: Lovenox.  Advanced Care Planning:  Code Status: full code.  Family Communication:  The plan of care was discussed in details with the patient (and family). I answered all questions. The patient agreed to proceed with the above mentioned plan. Further management will depend upon hospital course. Disposition Plan: Back to previous home environment Consults called: Neurology. All the records are reviewed and case discussed with ED provider.  Status is: Inpatient   At the time of the admission, it appears that the appropriate admission status for this patient is inpatient.  This is judged to be reasonable and necessary in order to provide the required intensity of service to ensure the patient's safety given the presenting symptoms, physical exam findings and initial radiographic and laboratory data in the context of comorbid conditions.  The patient requires inpatient status due to high intensity of service, high risk of further deterioration and high frequency of surveillance required.  I certify that at the time of admission,  it is my clinical judgment that the patient will require inpatient hospital care extending more than 2 midnights.                            Dispo: The patient is from: Home              Anticipated d/c is to: Home              Patient currently is not medically stable to d/c.              Difficult to place patient: No  Christel Mormon M.D on 03/16/2022 at [place] AM  [facility]   From 7 PM-7 AM, contact night-coverage [email or web address]  CC: Primary care physician; [facility] [place]

## 2022-03-16 NOTE — Consult Note (Signed)
Neurology Consultation Reason for Consult: Seizure Referring Physician: Mansy, J  CC: Seizure  History is obtained from:chart review   HPI: Derrick Leonard is a 27 y.o. male with a history of HIV and CNS toxoplasmosis who presents with new onset seizure. At baseline, he is able to say one or two words, and sometimes short sentences. He is able to walk, but is unsteady and slow. He typically stays with his caretaker Lyda Jester, but was with his family when this seizure happened. His sister is the one to contact for decisions per Mr. Norton Blizzard.   Since his seizure yesterday, he has been becoming more and more awake.  Initially, there was concern for ongoing seizure given how long it took him to come around, but over the course of the evening he began following commands and is now more awake.  He is able to answer questions with head shakes/nods, but is not speaking currently.  He denies headache.   Past Medical History:  Diagnosis Date   Asthma    CNS toxoplasmosis ([place])    HIV (human immunodeficiency virus infection) ([place])      Family History  Problem Relation Age of Onset   Hypertension Father      Social History:  reports that he has been smoking cigarettes. He has been smoking an average of 1 pack per day. He has never used smokeless tobacco. He reports current alcohol use. He reports current drug use. Drug: Marijuana.   Exam: Current vital signs: BP 99/74   Pulse (!) 54   Temp 97.9 F (36.6 C) (Oral)   Resp 11   Ht '5\' 9"'$  (1.753 m)   Wt 73.9 kg   SpO2 96%   BMI 24.06 kg/m  Vital signs in last 24 hours: Temp:  [97.9 F (36.6 C)-98.3 F (36.8 C)] 97.9 F (36.6 C) (02/27 0820) Pulse Rate:  [48-78] 54 (02/27 0820) Resp:  [11-18] 11 (02/27 0820) BP: (95-124)/(63-78) 99/74 (02/27 0820) SpO2:  [95 %-98 %] 96 % (02/27 0820) Weight:  [73.9 kg] 73.9 kg (02/27 0542)   Physical Exam  Appears well-developed and well-nourished.   Neuro: Mental Status: Patient is awake,  alert, able to answer questions with head shakes/nods, follows commands, but does not speak  Cranial Nerves: II: Visual Fields are full(able to count fingers by showing on his hand how many I am holding up). Pupils are equal, round, and reactive to light.   III,IV, VI: EOMI without ptosis or diploplia.  V: Facial sensation is symmetric to temperature VII: Facial movement is symmetric.  VIII: hearing is intact to voice X: Uvula elevates symmetrically XI: Shoulder shrug is symmetric. XII: tongue is midline without atrophy or fasciculations.  Motor: Tone is normal. Bulk is normal. 5/5 strength was present in all four extremities.  Sensory: Sensation is symmetric to light touch and temperature in the arms and legs. Cerebellar: No clear ataxia   I have reviewed labs in epic and the results pertinent to this consultation are: Cmp - normal UDS + for THC   I have reviewed the images obtained: MRI brain-no acute findings, sequela of previous infection  Impression: 27 year old male with previous CNS toxoplasmosis who presents with new onset seizure.  From the description it sounds like he may have had episodes concerning for possible seizures previously around the time he had his infection, but nothing more recently.  He is improving, low suspicion for recurrent infection at this time, though if he does not continue to return to baseline  may need to consider LP.  EEG has already been performed at the time of finalizing this note, which demonstrates no ongoing seizure activity.  Given his history and a clear seizure, I would favor continued antiepileptic therapy.  Recommendations: 1) Keppra 500 mg twice daily 2) neurology will continue to follow   Roland Rack, MD [facility] [phone]  If 7pm- 7am, please page neurology on call as listed in [place].

## 2022-03-16 NOTE — Procedures (Signed)
History: 27 year old male with history of CNS toxoplasmosis who presents with new onset seizure  Sedation: None  Technique: This EEG was acquired with electrodes placed according to the International 10-20 electrode system (including Fp1, Fp2, F3, F4, C3, C4, P3, P4, O1, O2, T3, T4, T5, T6, A1, A2, Fz, Cz, Pz). The following electrodes were missing or displaced: none.   Background: The background consists of intermixed alpha and beta activities. There is a well defined posterior dominant rhythm of 10 Hz that attenuates with eye opening. Sleep is recorded with normal appearing structures.   Photic stimulation: Physiologic driving is not performed  EEG Abnormalities: None  Clinical Interpretation: This normal EEG is recorded in the waking and sleep state.  Frequent eye blinking was observed without electrographic correlate. there was no seizure or seizure predisposition recorded on this study. Please note that lack of epileptiform activity on EEG does not preclude the possibility of epilepsy.   Roland Rack, MD [facility] [phone]  If 7pm- 7am, please page neurology on call as listed in [place].

## 2022-03-16 NOTE — Assessment & Plan Note (Signed)
Continue his Sinemet.

## 2022-03-16 NOTE — Progress Notes (Signed)
Admission from earlier this morning. 27 year old M with PMH of HIV/AIDS, prior CNS toxoplasmosis leading to parkinsonism and speech difficulty, mood disorder, and asthma admitted with seizure-like activity and tremors.  No prior history of seizure.  Vitals and labs stable.  UDS with marijuana.  CT head and MRI brain without acute finding.  Evaluated by teleneurology.  Started on IV Keppra and admitted.  Neurology following.  EEG pending.  Patient is awake but does not communicate.  Follows some commands.  Blinking more frequent than normal.  No focal neurodeficit.  Resume home Biktarvy, Bactrim and Sinemet.  Will follow EEG and neurology recommendation.  No charge service

## 2022-03-16 NOTE — ED Notes (Signed)
Family/ friend at the scene states that patient was pacing around the room per usual, waiting for his phone to charge. She had him sit down to serve him spaghetti and he fell out of the chair onto the floor. States that he was having full body seizure like shaking activity for 1 minute. During seizure,  patient became pale, cyanotic with pupils dilated. States afterward episode he aroused but was not in his normal state. Nonverbal at baseline however afterwards he had a fixed stare.

## 2022-03-16 NOTE — Assessment & Plan Note (Addendum)
Follow-up as outpatient with neurology

## 2022-03-16 NOTE — ED Notes (Signed)
called to [facility] per MD Ward/rep:tammy

## 2022-03-16 NOTE — ED Notes (Signed)
Called to [facility] per MD Ward/fax facesheet/mri imaging not available at this time/rep:kim.Marland KitchenMarland Kitchen

## 2022-03-16 NOTE — Assessment & Plan Note (Signed)
Patient's antiretroviral therapy was continued and prophylactic Bactrim for toxoplasmosis.

## 2022-03-16 NOTE — ED Notes (Signed)
Patient able to answer questions in short sentences. Patient able to stand with assistance to urinate and tolerated well.

## 2022-03-16 NOTE — ED Notes (Signed)
Pt to room assignment at this time

## 2022-03-16 NOTE — ED Notes (Signed)
[facility] Essense Bousquet, DO for transfer due to seizures

## 2022-03-16 NOTE — ED Provider Notes (Signed)
[facility] Provider Note    Event Date/Time   First MD Initiated Contact with Patient 03/16/22 0033     (approximate)   History   Seizures and Tremors (At friends  house, witnesses states he was standing, started bumping into tables and the walls then went to the floor in what they described as "Seizure" for less than 1 minute, aroused but was not normal self. Hx of Parkinsons lesions and tremors. No Hx of seizures per mother. )   HPI  Derrick Leonard is a 27 y.o. 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  Father reports patient was at a friend's house when he had what was reported to be a seizure.  His friend states that he was sitting in a chair and leaned over and fell out of the chair and had a generalized tonic-clonic seizure for about a minute and was postictal afterwards.  Family reports no previous history of seizures.  No known recent head injury.  Family reports compliance with his antiretrovirals although last CD4 count in December 2023 was 28.  He is chronically on Bactrim.  Father denies any history of drug or alcohol use.  History provided by EMS, father at bedside, friend who witnessed seizure.    Past Medical History:  Diagnosis Date   Asthma    CNS toxoplasmosis ([place])    HIV (human immunodeficiency virus infection) ([place])     Past Surgical History:  Procedure Laterality Date   LAPAROSCOPIC APPENDECTOMY N/A 06/30/2015   Procedure: APPENDECTOMY LAPAROSCOPIC;  Surgeon: Jules Husbands, MD;  Location: [facility] ORS;  Service: General;  Laterality: N/A;    MEDICATIONS:  Prior to Admission medications   Medication Sig Start Date End Date Taking? Authorizing Provider  acyclovir (ZOVIRAX) 800 MG tablet Take 1 tablet (800 mg total) by mouth daily.  11/10/19   Jerene Dilling, PA  bictegravir-emtricitabine-tenofovir AF (BIKTARVY) 50-200-25 MG TABS tablet Take 1 tablet by mouth daily. 12/12/19   [provider]  carbidopa-levodopa (SINEMET IR) 25-100 MG tablet Take by mouth. 03/06/20 03/06/21  [provider]  diphenhydrAMINE (BENADRYL) 25 mg capsule Take 2 capsules (50 mg total) by mouth every 4 (four) hours as needed. 07/15/16 07/15/17  Rudene Re, MD  famotidine (PEPCID) 20 MG tablet Take 1 tablet (20 mg total) by mouth daily. 07/12/16 07/12/17  Cuthriell, Charline Bills, PA-C  OLANZapine (ZYPREXA) 5 MG tablet  12/21/19   [provider]  sertraline (ZOLOFT) 50 MG tablet Take by mouth.    [provider]  sulfaDIAZINE 500 MG tablet Take by mouth. 04/10/20   [provider]  valACYclovir (VALTREX) 1000 MG tablet Take 1 tablet (1,000 mg total) by mouth 2 (two) times daily. 04/12/20   Coral Spikes, DO  valACYclovir (VALTREX) 500 MG tablet Take 1 tablet (500 mg total) by mouth 2 (two) times daily. Start after initial course. Suppressive therapy. 04/12/20   Coral Spikes, DO    Physical Exam   Triage Vital Signs: ED Triage Vitals [03/16/22 0022]  Enc Vitals Group     BP 124/78     Pulse Rate 78     Resp 18     Temp 98.3 F (36.8 C)     Temp Source Oral     SpO2 98 %     Weight  Height      Head Circumference      Peak Flow      Pain Score      Pain Loc      Pain Edu?      Excl. in [place]?     Most recent vital signs: Vitals:   03/16/22 0130 03/16/22 0300  BP: 100/64 103/65  Pulse: 66 66  Resp: 13 15  Temp:    SpO2: 95% 95%    CONSTITUTIONAL: Alert, will answer yes or no.  Appears postictal. HEAD: Normocephalic, atraumatic EYES: Conjunctivae clear, pupils appear equal, sclera nonicteric ENT: normal nose; moist mucous membranes NECK: Supple, normal ROM CARD: RRR; S1 and S2 appreciated RESP: Normal chest excursion without splinting or tachypnea; breath sounds clear and equal  bilaterally; no wheezes, no rhonchi, no rales, no hypoxia or respiratory distress, speaking full sentences ABD/GI: Non-distended; soft, non-tender, no rebound, no guarding, no peritoneal signs BACK: The back appears normal EXT: Normal ROM in all joints; no deformity noted, no edema SKIN: Normal color for age and race; warm; no rash on exposed skin NEURO: Patient will state yes and no but does not answer any other questions.  He will follow commands.  Eyes are open. PSYCH: The patient's mood and manner are appropriate.   ED Results / Procedures / Treatments   LABS: (all labs ordered are listed, but only abnormal results are displayed) Labs Reviewed  COMPREHENSIVE METABOLIC PANEL - Abnormal; Notable for the following components:      Result Value   Glucose, Bld 129 (*)    All other components within normal limits  CBC WITH DIFFERENTIAL/PLATELET - Abnormal; Notable for the following components:   Platelets 141 (*)    All other components within normal limits  URINE DRUG SCREEN, QUALITATIVE ([facility] ONLY) - Abnormal; Notable for the following components:   Cannabinoid 50 Ng, Ur [place] POSITIVE (*)    All other components within normal limits  URINALYSIS, ROUTINE W REFLEX MICROSCOPIC - Abnormal; Notable for the following components:   Color, Urine YELLOW (*)    APPearance HAZY (*)    All other components within normal limits  CBG MONITORING, ED - Abnormal; Notable for the following components:   Glucose-Capillary 103 (*)    All other components within normal limits  ETHANOL  CBG MONITORING, ED     EKG:  EKG Interpretation  Date/Time:  Tuesday March 16 2022 00:43:56 EST Ventricular Rate:  69 PR Interval:  135 QRS Duration: 88 QT Interval:  383 QTC Calculation: 411 R Axis:   96 Text Interpretation: Sinus rhythm Borderline right axis deviation Confirmed by Pryor Curia [phone]) on 03/16/2022 1:11:04 AM         RADIOLOGY: My personal review and interpretation of imaging: CT  head unremarkable.  I have personally reviewed all radiology reports.   CT HEAD WO CONTRAST (5MM)  Result Date: 03/16/2022 CLINICAL DATA:  Seizure. EXAM: CT HEAD WITHOUT CONTRAST TECHNIQUE: Contiguous axial images were obtained from the base of the skull through the vertex without intravenous contrast. RADIATION DOSE REDUCTION: This exam was performed according to the departmental dose-optimization program which includes automated exposure control, adjustment of the mA and/or kV according to patient size and/or use of iterative reconstruction technique. COMPARISON:  July 26, 2014 FINDINGS: Brain: No evidence of acute infarction, hemorrhage, hydrocephalus, extra-axial collection or mass lesion/mass effect. Vascular: No hyperdense vessel or unexpected calcification. Skull: Normal. Negative for fracture or focal lesion. Sinuses/Orbits: No acute finding. Other: None. IMPRESSION: No acute intracranial  pathology. Electronically Signed   By: Virgina Norfolk M.D.   On: 03/16/2022 01:04     PROCEDURES:  Critical Care performed: Yes, see critical care procedure note(s)   CRITICAL CARE Performed by: Cyril Mourning Latisha Lasch   Total critical care time: 40 minutes  Critical care time was exclusive of separately billable procedures and treating other patients.  Critical care was necessary to treat or prevent imminent or life-threatening deterioration.  Critical care was time spent personally by me on the following activities: development of treatment plan with patient and/or surrogate as well as nursing, discussions with consultants, evaluation of patient's response to treatment, examination of patient, obtaining history from patient or surrogate, ordering and performing treatments and interventions, ordering and review of laboratory studies, ordering and review of radiographic studies, pulse oximetry and re-evaluation of patient's condition.   Marland Kitchen1-3 Lead EKG Interpretation  Performed by: Kelilah Hebard, Delice Bison,  DO Authorized by: Corrion Stirewalt, Delice Bison, DO     Interpretation: normal     ECG rate:  66   ECG rate assessment: normal     Rhythm: sinus rhythm     Ectopy: none     Conduction: normal       IMPRESSION / MDM / ASSESSMENT AND PLAN / ED COURSE  I reviewed the triage vital signs and the nursing notes.    Patient here with seizure-like activity.  Now altered and not at his baseline per father.  The patient is on the cardiac monitor to evaluate for evidence of arrhythmia and/or significant heart rate changes.   DIFFERENTIAL DIAGNOSIS (includes but not limited to):   Seizure, intracranial hemorrhage, stroke, intracranial infection, intoxication, drug use   Patient's presentation is most consistent with acute presentation with potential threat to life or bodily function.   PLAN: Will obtain CBC, CMP, ethanol level, urinalysis, urine drug screen.  Given complex history and previous CNS infection, will give IV Keppra although this is his first seizure.  EKG nonischemic without arrhythmia.  Patient still postictal.  He may require admission to the hospital.   [facility]: Medications  levETIRAcetam (KEPPRA) IVPB 1000 mg/100 mL premix (0 mg Intravenous Stopped 03/16/22 0104)  gadobutrol (GADAVIST) 1 MMOL/ML injection 7 mL (7 mLs Intravenous Contrast Given 03/16/22 0427)     ED COURSE: Labs show no leukocytosis, normal hemoglobin, normal electrolytes, normal glucose, no UTI.  Drug screen positive for cannabinoids which is chronic for him.  CT head reviewed and interpreted by myself and the radiologist and is unremarkable.   Teleneurology has seen patient and they were concerned that he could be in subclinical status given he appeared somnolent during their exam and recommended that he be transferred somewhere for continuous EEG monitoring if he did not return to his baseline in the next hour.  We are unable to perform continuous EEG monitoring here at [facility].  Family would like him to  be transferred to [facility] where he has had all of his care previously.  Will discuss with the transfer center.     CONSULTS:  3:42 AM  Spoke with Dr. Vivianne Master - Roanna Raider.  Unable to accept to [facility] at this time given there is no beds available.  Given patient is following commands intermittently, low suspicion that this is subclinical status.  Will discuss with our hospitalist.  He could be transferred to [facility] at a later time if needed.  3:57 AM  D/w Dr. Sidney Ace with hospitalist service who does not feel comfortable excepting the patient to [facility] given telemetry  neurologist recommendations of continuous EEG monitoring which is not available for [place] at [facility].  He request that we consult neurology in [place].  4:10 AM  Spoke with Dr. Lorrin Goodell with neurology at [facility].  They are unable to accept the patient at this time given no beds available.  He also agrees that subclinical status unlikely given patient intermittently following commands but does recommend discussion with [facility] given patient was seen by a tele neurologist who does recommend continuous EEG monitoring.  4:25 AM  Spoke with [facility].  Unable to accept due to lack of beds.  Will be reviewed by administrators at 6:30 am.   4:28 AM  Spoke with MRI tech.  They state patient is trying to get up off the stretcher during the MRI.  I have asked for them to bring the patient back to the room so that we can have the neurologist see him again so that they can see that he is moving his extremities purposefully and trying to sit upright on his own and that this is not suggestive of subclinical status so that they can remove this from their note or make an addendum so that we can get him admitted here to [facility] given there is no availability at [facility], [facility] or [employer] [facility].  4:36 AM  Discussed with tele neurologist who states that she will addend her note in order for the hospitalist to feel more comfortable admitting the patient here.  Again very low clinical suspicion  throughout this entire visit the patient has been in subclinical status given he has been moving all 4 extremities and intermittently following commands.  His only focal neurologic deficit is that he is not talking but he has a chronic speech disability so this is not far off from his baseline.   4:38 AM  D/w hospitalist Dr. Sidney Ace.  Given neurologist has addended her recommendations and does not feel he needs continuous EEG given he is moving and his exam has improved, she feels he is appropriate to be admitted here for EEG in the morning.  Hospitalist to admit.   OUTSIDE RECORDS REVIEWED: Reviewed recent neurology and infectious disease notes at [facility].       FINAL CLINICAL IMPRESSION(S) / ED DIAGNOSES   Final diagnoses:  Seizure-like activity ([place])     Rx / DC Orders   ED Discharge Orders     None        Note:  This document was prepared using Dragon voice recognition software and may include unintentional dictation errors.   Madie Cahn, Delice Bison, DO 03/16/22 0500

## 2022-03-16 NOTE — Assessment & Plan Note (Signed)
Patient was loaded with IV Keppra and will go home on liquid Keppra.  Seen by neurology and cleared to go home.  Doing better than yesterday.  EEG did not show any seizure activity.  MRI of the brain did not show any acute or reversible finding.  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

## 2022-03-16 NOTE — Progress Notes (Signed)
EEG complete - results pending 

## 2022-03-16 NOTE — ED Notes (Signed)
called to tele-specialist per MD Ward/rep:ryan.Marland Kitchen

## 2022-03-16 NOTE — ED Notes (Signed)
called [facility] to cancel patient being on waitlist per MD Ward/rep:wanda.Marland Kitchen

## 2022-03-17 ENCOUNTER — Other Ambulatory Visit (HOSPITAL_COMMUNITY): Payer: Self-pay

## 2022-03-17 DIAGNOSIS — B20 Human immunodeficiency virus [HIV] disease: Secondary | ICD-10-CM | POA: Diagnosis not present

## 2022-03-17 DIAGNOSIS — R419 Unspecified symptoms and signs involving cognitive functions and awareness: Secondary | ICD-10-CM | POA: Diagnosis not present

## 2022-03-17 DIAGNOSIS — R569 Unspecified convulsions: Secondary | ICD-10-CM | POA: Diagnosis not present

## 2022-03-17 DIAGNOSIS — G20C Parkinsonism, unspecified: Secondary | ICD-10-CM | POA: Diagnosis not present

## 2022-03-17 DIAGNOSIS — G6289 Other specified polyneuropathies: Secondary | ICD-10-CM

## 2022-03-17 LAB — CBC
HCT: 40.4 % (ref 39.0–52.0)
Hemoglobin: 13.5 g/dL (ref 13.0–17.0)
MCH: 32.4 pg (ref 26.0–34.0)
MCHC: 33.4 g/dL (ref 30.0–36.0)
MCV: 96.9 fL (ref 80.0–100.0)
Platelets: 124 10*3/uL — ABNORMAL LOW (ref 150–400)
RBC: 4.17 MIL/uL — ABNORMAL LOW (ref 4.22–5.81)
RDW: 12.6 % (ref 11.5–15.5)
WBC: 3.8 10*3/uL — ABNORMAL LOW (ref 4.0–10.5)
nRBC: 0 % (ref 0.0–0.2)

## 2022-03-17 LAB — RENAL FUNCTION PANEL
Albumin: 3.7 g/dL (ref 3.5–5.0)
Anion gap: 5 (ref 5–15)
BUN: 16 mg/dL (ref 6–20)
CO2: 23 mmol/L (ref 22–32)
Calcium: 8.3 mg/dL — ABNORMAL LOW (ref 8.9–10.3)
Chloride: 108 mmol/L (ref 98–111)
Creatinine, Ser: 0.81 mg/dL (ref 0.61–1.24)
GFR, Estimated: >60 mL/min (ref >60)
Glucose, Bld: 92 mg/dL (ref 70–99)
Phosphorus: 3.5 mg/dL (ref 2.5–4.6)
Potassium: 3.9 mmol/L (ref 3.5–5.1)
Sodium: 136 mmol/L (ref 135–145)

## 2022-03-17 LAB — MAGNESIUM: Magnesium: 2.1 mg/dL (ref 1.7–2.4)

## 2022-03-17 MED ORDER — CARBIDOPA-LEVODOPA 10-100MG/5ML ORAL SUSPENSION: 20.0000 mL | Freq: Three times a day (TID) | ORAL | Status: DC

## 2022-03-17 MED ORDER — SULFAMETHOXAZOLE-TRIMETHOPRIM 200-40 MG/5ML PO SUSP: 20.0000 mL | Freq: Every day | ORAL | 0 refills | Status: DC

## 2022-03-17 MED ORDER — LEVETIRACETAM 100 MG/ML PO SOLN: 500.0000 mg | Freq: Two times a day (BID) | ORAL | 0 refills | Status: AC

## 2022-03-17 MED ORDER — NONFORMULARY OR COMPOUNDED ITEM: 20.0000 mL | Freq: Three times a day (TID) | Status: DC

## 2022-03-17 MED ORDER — LEVETIRACETAM 100 MG/ML PO SOLN
500.0000 mg | Freq: Two times a day (BID) | ORAL | Status: DC
  Administered 2022-03-17: 500 mg via ORAL
  Filled 2022-03-17: qty 5

## 2022-03-17 NOTE — TOC Benefit Eligibility Note (Signed)
Patient Teacher, English as a foreign language completed.    The patient is currently admitted and upon discharge could be taking levetiracetam (Keppra) 100 mg/ml solution.  The current 30 day co-pay is $1.55.   The patient is insured through [place] Patient Advocate Specialist [place] Patient Advocate Team Direct Number: [phone]  Fax: [phone]

## 2022-03-17 NOTE — Hospital Course (Signed)
27 y.o. Caucasian male with medical history significant for poorly controlled HIV with most recent CD4 count of 28 in December, followed at [facility], CNS toxoplasmosis in 2021 leading to Parkinson's-like symptoms with chronic speech difficulty and asthma, who presented to the emergency room with acute onset of seizure-like activity and tremors at a friend's house.  He was standing in start bumping into tables and walls then went to the floor and what his friends describe the seizure lasted less than a minute.  When aroused he was not back to his normal self.  He has been compliant with his antiretroviral therapy per his family who reported no previous history of seizures.  No reported fever or chills.  No nausea or vomiting or abdominal pain.  No reported chest pain, palpitations, dyspnea or cough or wheezing.   ED Course: When he came to the ER, vital signs were within normal.  Labs revealed unremarkable CMP and CBC except for mild thrombocytopenia of 141.  UA was negative.  Urine drug screen came back positive for cannabinoids.  Alcohol level was less than 10. EKG as reviewed by me : Normal sinus rhythm with a rate of 69 with borderline right axis deviation. Imaging: Noncontrasted CT scan revealed no acute intracranial normalities.  2 view chest x-ray showed no acute cardiopulmonary disease.  Brain MRI without contrast showed remote insult to bilateral globus palidu with no acute or reversible findings.   The patient was given 1 g of IV Keppra.  Teleneurology consult was obtained and recommendation initially was for continuous EEG and therefore transfer but later teleneurology recommendation was for admission here with clinical improvement and obtaining EEG here and inpatient neurology follow-up.  The patient did not have any recurrent seizures and was arousable on responding to commands.    2/28.  Patient doing better today.  Cleared by neurology to go home on Keppra.  The patient does not drive and I advised  no driving.  Patient did well with physical therapy.

## 2022-03-17 NOTE — Progress Notes (Signed)
Subjective: Back to baseline per caregiver, no complaints of headache  Exam: Vitals:   03/17/22 0417 03/17/22 1017  BP: 109/66 103/68  Pulse: (!) 48 69  Resp: 16 18  Temp: 97.7 F (36.5 C) 98.3 F (36.8 C)  SpO2: 99% 98%   Gen: In bed, NAD Resp: non-labored breathing, no acute distress Abd: soft, nt  Neuro: MS: Awake, alert, able to tell me the month, year, location.  He does have a paucity of speech, answering one or two words, but able to answer appropriately. CN: EOMI, visual fields full, face symmetric Motor: 5/5 throughout Sensory: Symmetric to light touch   Pertinent Labs: Creatinine 1.17  Impression: 27 year old male with a history of CNS toxoplasmosis now on treatment presenting with seizure.  He did apparently have some episodes concerning for staring spells during his previous hospitalization per his sister, but nothing since that time.  Whether this represents new onset seizure or recurrent seizure, I would favor starting antiepileptic therapy in any case.  With his return to baseline and negative imaging, low suspicion for any type of treatment failure  Recommendations: 1)  Keppra 500 mg twice daily, okay to use liquid 2) follow-up with his usual outpatient neurologist 3) inpatient neurology will be available as needed.  Roland Rack, MD [facility] [phone]  If 7pm- 7am, please page neurology on call as listed in [place].

## 2022-03-17 NOTE — Evaluation (Signed)
Physical Therapy Evaluation Patient Details Name: MARKELLE NELLI MRN: [id number] DOB: 1995-05-05 Today's Date: 03/17/2022  History of Present Illness  Pt is a 27 y.o. caucasian male with medical history significant for poorly controlled HIV with most recent CD4 count of 28 in December, followed at [facility], CNS toxoplasmosis in 2021 leading to Parkinson's-like symptoms with chronic speech difficulty and asthma, who presented to the emergency room with acute onset of seizure-like activity and tremors at a friend's house.  MD assessment includes: seizure, AIDS, Parkinsonism, peripheral neuropathy, and neurocognitive disorder.   Clinical Impression  Pt was pleasant and motivated to participate during the session and put forth good effort throughout. Pt's caregiver was in the room and stated at baseline pt ambulates without an AD with SBA, needs assistance with ADLs, and has a history of falls.  Pt has recently completed a bout of OPPT.  Pt required no physical assistance with bed mobility, transfers, or gait and was able to amb 150 feet without an AD with good cadence and stability.  Per caregiver report pt appears at his functional baseline with no skilled PT needs identified with recommendation for pt to continue to receive close SBA during ambulation secondary to fall history with caregiver in agreement.  Will complete PT orders at this time but will reassess pt pending a change in status upon receipt of new PT orders.         Recommendations for follow up therapy are one component of a multi-disciplinary discharge planning process, led by the attending physician.  Recommendations may be updated based on patient status, additional functional criteria and insurance authorization.  Follow Up Recommendations No PT follow up      Assistance Recommended at Discharge Intermittent Supervision/Assistance  Patient can return home with the following  A little help with walking and/or transfers;A little help  with bathing/dressing/bathroom;Assist for transportation    Equipment Recommendations None recommended by PT  Recommendations for Other Services       Functional Status Assessment Patient has not had a recent decline in their functional status     Precautions / Restrictions Precautions Precautions: Fall Restrictions Weight Bearing Restrictions: No Other Position/Activity Restrictions: Seizure precautions      Mobility  Bed Mobility Overal bed mobility: Modified Independent             General bed mobility comments: Min extra time and effort only    Transfers Overall transfer level: Needs assistance Equipment used: None Transfers: Sit to/from Stand Sit to Stand: Supervision           General transfer comment: Good eccentric and concentric control and stabiltiy    Ambulation/Gait Ambulation/Gait assistance: Supervision Gait Distance (Feet): 150 Feet Assistive device: None Gait Pattern/deviations: WFL(Within Functional Limits) Gait velocity: decreased     General Gait Details: Very good cadence during ambulation without an AD with no LOB or instability noted  Stairs            Wheelchair Mobility    Modified Rankin (Stroke Patients Only)       Balance Overall balance assessment: Needs assistance, History of Falls   Sitting balance-Leahy Scale: Normal     Standing balance support: During functional activity, No upper extremity supported Standing balance-Leahy Scale: Good                               Pertinent Vitals/Pain Pain Assessment Pain Assessment: 0-10 Pain Score: 8  Pain Location: bilateral  feet Pain Descriptors / Indicators: Sore Pain Intervention(s): Repositioned, Premedicated before session, Monitored during session    Home Living Family/patient expects to be discharged to:: Private residence Living Arrangements: Non-relatives/Friends Available Help at Discharge: Personal care attendant Type of Home:  House Home Access: Level entry       Home Layout: One level Home Equipment: Conservation officer, nature (2 wheels) Additional Comments: Pt to stay with caretaker upon discharge with 24/7 supervision; history and PLOF from a combination of patient and caregiver in room    Prior Function Prior Level of Function : Needs assist             Mobility Comments: SBA with ambulation without an AD, 4-5 falls in the last 2 months from tripping, recently completed bout of OPPT ADLs Comments: Caregiver assists with ADLs as needed     Hand Dominance   Dominant Hand: Right    Extremity/Trunk Assessment   Upper Extremity Assessment Upper Extremity Assessment: Overall WFL for tasks assessed    Lower Extremity Assessment Lower Extremity Assessment: Overall WFL for tasks assessed       Communication   Communication: No difficulties  Cognition Arousal/Alertness: Awake/alert Behavior During Therapy: WFL for tasks assessed/performed Overall Cognitive Status: Within Functional Limits for tasks assessed                                          General Comments      Exercises     Assessment/Plan    PT Assessment Patient does not need any further PT services  PT Problem List         PT Treatment Interventions      PT Goals (Current goals can be found in the Care Plan section)  Acute Rehab PT Goals PT Goal Formulation: All assessment and education complete, DC therapy    Frequency       Co-evaluation               AM-PAC PT "6 Clicks" Mobility  Outcome Measure Help needed turning from your back to your side while in a flat bed without using bedrails?: None Help needed moving from lying on your back to sitting on the side of a flat bed without using bedrails?: None Help needed moving to and from a bed to a chair (including a wheelchair)?: A Little Help needed standing up from a chair using your arms (e.g., wheelchair or bedside chair)?: A Little Help needed to  walk in hospital room?: A Little Help needed climbing 3-5 steps with a railing? : A Little 6 Click Score: 20    End of Session Equipment Utilized During Treatment: Gait belt Activity Tolerance: Patient tolerated treatment well Patient left: in bed;with call bell/phone within reach;with bed alarm set;with family/visitor present Nurse Communication: Mobility status PT Visit Diagnosis: Difficulty in walking, not elsewhere classified (R26.2)    Time: [id number] PT Time Calculation (min) (ACUTE ONLY): 27 min   Charges:   PT Evaluation $PT Eval Low Complexity: 1 Low        D. Scott Ilisha Blust PT, DPT 03/17/22, 11:43 AM

## 2022-03-17 NOTE — Discharge Instructions (Addendum)
No Driving  Follow up with your neurologist and ID specialist

## 2022-03-17 NOTE — Progress Notes (Signed)
OT Cancellation Note  Patient Details Name: Derrick Leonard MRN: [id number] DOB: 09-01-1995   Cancelled Treatment:    Reason Eval/Treat Not Completed: OT screened, no needs identified, will sign off. Caregiver Ysidro Evert) in room assisting pt to use urinal. Per caregiver, pt is at baseline and no OT needs at this time. Pt to d/c to care of caregiver Ysidro Evert. OT notified MD and RN. Per PT, pt has cleared PT with no needs.  Vania Rea 03/17/2022, 2:49 PM

## 2022-03-17 NOTE — Plan of Care (Signed)

## 2022-03-17 NOTE — Care Management Important Message (Signed)
Important Message  Patient Details  Name: Derrick Leonard MRN: [id number] Date of Birth: [birth year]   Medicare Important Message Given:  Yes     Laurena Slimmer, RN 03/17/2022, 12:42 PM

## 2022-03-17 NOTE — Care Management CC44 (Signed)
Condition Code 44 Documentation Completed  Patient Details  Name: Derrick Leonard MRN: [id number] Date of Birth: Oct 25, 1995   Condition Code 44 given:  Yes Patient signature on Condition Code 44 notice:  Yes Documentation of 2 MD's agreement:  Yes Code 44 added to claim:  Yes    Laurena Slimmer, RN 03/17/2022, 12:41 PM

## 2022-03-17 NOTE — Discharge Summary (Signed)
Physician Discharge Summary   Patient: Derrick Leonard MRN: [id number] DOB: Dec 04, 1995  Admit date:     03/16/2022  Discharge date: 03/17/22  Discharge Physician: Loletha Grayer   PCP: [facility] [place]   Recommendations at discharge:   Follow-up with your PCP Follow-up with your neurologist at [facility] with your ID specialist at [facility]  Discharge Diagnoses: Principal Problem:   Seizure [facility]) Active Problems:   AIDS [facility])   Neurocognitive disorder   Peripheral neuropathy   Parkinsonism    Hospital Course: 27 y.o. 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  He was standing in start bumping into tables and walls then went to the floor and what his friends describe the seizure lasted less than a minute.  When aroused he was not back to his normal self.  He has been compliant with his antiretroviral therapy per his family who reported no previous history of seizures.  No reported fever or chills.  No nausea or vomiting or abdominal pain.  No reported chest pain, palpitations, dyspnea or cough or wheezing.   ED Course: When he came to the ER, vital signs were within normal.  Labs revealed unremarkable CMP and CBC except for mild thrombocytopenia of 141.  UA was negative.  Urine drug screen came back positive for cannabinoids.  Alcohol level was less than 10. EKG as reviewed by me : Normal sinus rhythm with a rate of 69 with borderline right axis deviation. Imaging: Noncontrasted CT scan revealed no acute intracranial normalities.  2 view chest x-ray showed no acute cardiopulmonary disease.  Brain MRI without contrast showed remote insult to bilateral globus palidu  with no acute or reversible findings.   The patient was given 1 g of IV Keppra.  Teleneurology consult was obtained and recommendation initially was for continuous EEG and therefore transfer but later teleneurology recommendation was for admission here with clinical improvement and obtaining EEG here and inpatient neurology follow-up.  The patient did not have any recurrent seizures and was arousable on responding to commands.    2/28.  Patient doing better today.  Cleared by neurology to go home on Keppra.  The patient does not drive and I advised no driving.  Patient did well with physical therapy.  Assessment and Plan: * Seizure [facility]) Patient was loaded with IV Keppra and will go home on liquid Keppra.  Seen by neurology and cleared to go home.  Doing better than yesterday.  EEG did not show any seizure activity.  MRI of the brain did not show any acute or reversible finding.  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  AIDS ([place]) Patient's antiretroviral therapy was continued and prophylactic Bactrim for toxoplasmosis.  Parkinsonism Continue his Sinemet.  Peripheral neuropathy Continue his Neurontin.  Neurocognitive disorder Follow-up as outpatient with neurology         Consultants: Neurology Procedures performed: None Disposition: Home Diet recommendation:  Regular diet DISCHARGE MEDICATION: Allergies as of 03/17/2022       Reactions   Cheese Swelling   Amoxicillin Rash        Medication List     STOP taking these medications    acyclovir 800 MG tablet Commonly known as: Zovirax   carbidopa-levodopa  25-100 MG tablet Commonly known as: SINEMET IR   diphenhydrAMINE 25 mg capsule Commonly known as: BENADRYL   DULoxetine 20 MG capsule Commonly known as: CYMBALTA   famotidine 20 MG tablet Commonly known as: PEPCID   lidocaine 2 % solution Commonly known as: XYLOCAINE   OLANZapine 5 MG tablet Commonly known as: ZYPREXA   sertraline 50 MG  tablet Commonly known as: ZOLOFT   sulfaDIAZINE 500 MG tablet   valACYclovir 1000 MG tablet Commonly known as: VALTREX   valACYclovir 500 MG tablet Commonly known as: Valtrex       TAKE these medications    Biktarvy 50-200-25 MG Tabs tablet Generic drug: bictegravir-emtricitabine-tenofovir AF Take 1 tablet by mouth daily.   gabapentin 250 MG/5ML solution Commonly known as: NEURONTIN Take 300 mg by mouth at bedtime as needed.   levETIRAcetam 100 MG/ML solution Commonly known as: KEPPRA Take 5 mLs (500 mg total) by mouth 2 (two) times daily.   sulfamethoxazole-trimethoprim 200-40 MG/5ML suspension Commonly known as: BACTRIM Take 20 mLs by mouth daily.        Follow-up [place], [place] Follow up in 5 day(s).   Specialty: General Practice Contact information: [place] [address] [phone]                Discharge Exam: Filed Weights   04-09-22 0542  Weight: 73.9 kg   Physical Exam HENT:     Head: Normocephalic.     Mouth/Throat:     Pharynx: No oropharyngeal exudate.  Eyes:     General: Lids are normal.     Conjunctiva/sclera: Conjunctivae normal.  Cardiovascular:     Rate and Rhythm: Normal rate and regular rhythm.     Heart sounds: Normal heart sounds, S1 normal and S2 normal.  Pulmonary:     Breath sounds: No decreased breath sounds, wheezing, rhonchi or rales.  Abdominal:     Palpations: Abdomen is soft.     Tenderness: There is no abdominal tenderness.  Musculoskeletal:     Right lower leg: No swelling.     Left lower leg: No swelling.  Skin:    General: Skin is warm.     Findings: No rash.  Neurological:     Mental Status: He is alert.     Comments: Answers questions.  Able to straight leg raise.  Able to lift his arms up off the bed.      Condition at discharge: stable  The results of significant diagnostics from this hospitalization (including imaging,  microbiology, ancillary and laboratory) are listed below for reference.   Imaging Studies: EEG adult  Result Date: 2022/04/09 Greta Doom, MD     2022/04/09  1:36 PM History: 27 year old male with history of CNS toxoplasmosis who presents with new onset seizure Sedation: None Technique: This EEG was acquired with electrodes placed according to the International 10-20 electrode system (including Fp1, Fp2, F3, F4, C3, C4, P3, P4, O1, O2, T3, T4, T5, T6, A1, A2, Fz, Cz, Pz). The following electrodes were missing or displaced: none. Background: The background consists of intermixed alpha and beta activities. There is a well defined posterior dominant rhythm of 10 Hz that attenuates with eye opening. Sleep is recorded with normal appearing structures. Photic stimulation: Physiologic driving is not performed EEG Abnormalities: None Clinical Interpretation: This normal EEG is recorded in the waking and sleep state.  Frequent eye blinking was observed without electrographic correlate. there was no seizure  or seizure predisposition recorded on this study. Please note that lack of epileptiform activity on EEG does not preclude the possibility of epilepsy. Roland Rack, MD [facility] [phone] If 7pm- 7am, please page neurology on call as listed in [place].   MR Brain W and Wo Contrast  Result Date: 03/16/2022 CLINICAL DATA:  Seizure, generalized, abnormal neuro exam. History of HIV and CNS toxo. EXAM: MRI HEAD WITHOUT AND WITH CONTRAST TECHNIQUE: Multiplanar, multiecho pulse sequences of the brain and surrounding structures were obtained without and with intravenous contrast. CONTRAST:  2m GADAVIST GADOBUTROL 1 MMOL/ML IV SOLN COMPARISON:  Head CT from earlier today FINDINGS: Brain: No acute infarction, hemorrhage, hydrocephalus, extra-axial collection or mass lesion. T2 hyperintensity with volume loss at the bilateral globus pallidus, encephalomalacia usually from prior global  toxic/metabolic insult. Brain volume is normal. No abnormal enhancement. No cortical finding to correlate with seizure history. Vascular: Normal flow voids. Skull and upper cervical spine: Normal marrow signal. Sinuses/Orbits: Mild mucosal thickening asymmetrically affecting right maxillary and ethmoid sinuses. Other: Negative IMPRESSION: 1. No acute or reversible finding. 2. Remote insult to the bilateral globus pallidus. Electronically Signed   By: JJorje GuildM.D.   On: 03/16/2022 05:00   CT HEAD WO CONTRAST (5MM)  Result Date: 03/16/2022 CLINICAL DATA:  Seizure. EXAM: CT HEAD WITHOUT CONTRAST TECHNIQUE: Contiguous axial images were obtained from the base of the skull through the vertex without intravenous contrast. RADIATION DOSE REDUCTION: This exam was performed according to the departmental dose-optimization program which includes automated exposure control, adjustment of the mA and/or kV according to patient size and/or use of iterative reconstruction technique. COMPARISON:  July 26, 2014 FINDINGS: Brain: No evidence of acute infarction, hemorrhage, hydrocephalus, extra-axial collection or mass lesion/mass effect. Vascular: No hyperdense vessel or unexpected calcification. Skull: Normal. Negative for fracture or focal lesion. Sinuses/Orbits: No acute finding. Other: None. IMPRESSION: No acute intracranial pathology. Electronically Signed   By: TVirgina NorfolkM.D.   On: 03/16/2022 01:04    Microbiology: Results for orders placed or performed during the hospital encounter of 07/01/19  Urine culture     Status: None   Collection Time: 07/01/19  8:45 AM   Specimen: Urine, Random  Result Value Ref Range Status   Specimen Description   Final    URINE, RANDOM [facility] [address] [place] [address]   Special Requests   Final    Immunocompromised [facility]formed at [facility] [address] [place] [address]   Culture   Final    NO  GROWTH [facility]formed at [facility] [address] [place] [address]   Report Status 07/02/2019 FINAL  Final  Chlamydia/NGC rt PCR ([facility])     Status: Abnormal   Collection Time: 07/01/19  8:46 AM  Result Value Ref Range Status   Specimen source GC/Chlam URINE, RANDOM  Final   Chlamydia Tr DETECTED (A) NOT DETECTED Final   N gonorrhoeae NOT DETECTED NOT DETECTED Final    Comment: (NOTE) This CT/NG assay has not been evaluated in patients with a history of  hysterectomy. [facility] [address] [place] [address]    Labs: CBC: Recent Labs  Lab 03/16/22 0024 03/17/22 0444  WBC 5.9 3.8*  NEUTROABS 4.1  --   HGB 14.8 13.5  HCT 43.7 40.4  MCV 94.0 96.9  PLT 141* [id number]   Basic Metabolic Panel: Recent Labs  Lab  03/16/22 0024 03/17/22 0444  NA 137 136  K 3.5 3.9  CL 103 108  CO2 26 23  GLUCOSE 129* 92  BUN 19 16  CREATININE 1.17 0.81  CALCIUM 9.1 8.3*  MG  --  2.1  PHOS  --  3.5   Liver Function Tests: Recent Labs  Lab 03/16/22 0024 03/17/22 0444  AST 19  --   ALT 16  --   ALKPHOS 66  --   BILITOT 0.8  --   PROT 7.2  --   ALBUMIN 4.4 3.7   CBG: Recent Labs  Lab 03/16/22 0455  GLUCAP 103*    Discharge time spent: greater than 30 minutes.  Signed: Loletha Grayer, MD [facility] 03/17/2022

## 2022-03-17 NOTE — Evaluation (Addendum)
Clinical/Bedside Swallow Evaluation Patient Details  Name: Derrick Leonard MRN: [id number] Date of Birth: [birth year]  Today's Date: 03/17/2022 Time: SLP Start Time (ACUTE ONLY): 0850 SLP Stop Time (ACUTE ONLY): 0950 SLP Time Calculation (min) (ACUTE ONLY): 60 min  Past Medical History:  Past Medical History:  Diagnosis Date   Asthma    CNS toxoplasmosis ([place])    HIV (human immunodeficiency virus infection) ([place])    Past Surgical History:  Past Surgical History:  Procedure Laterality Date   LAPAROSCOPIC APPENDECTOMY N/A 06/30/2015   Procedure: APPENDECTOMY LAPAROSCOPIC;  Surgeon: Jules Husbands, MD;  Location: [facility] ORS;  Service: General;  Laterality: N/A;   HPI:  Per H&P, pt is a 27 y.o. 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  He was standing in start bumping into tables and walls then went to the floor and what his friends describe the seizure lasted less than a minute.  When aroused he was not back to his normal self.  He has been compliant with his antiretroviral therapy per his family who reported no previous history of seizures.  No reported fever or chills.  No nausea or vomiting or abdominal pain.  No reported chest pain, palpitations, dyspnea or cough or wheezing.    Assessment / Plan / Recommendation  Clinical Impression   Pt seen today for BSE. Pt sitting up in bed w/ tray table in front of him upon SLP arrival. Pt communicated intermittently w/ SLP w/ single words and gestures. Pt's caregiver present throughout eval. Pt left sitting up in bed w/ tray table/breakfast in front of him, caregiver present, bed alarm set, and call button/cell phone in reach.  Pt on RA; afebrile;  WBC low.  Pt does NOT appear to present w/ any overt, consistent s/s of oropharyngeal dysphagia. Pt's swallowing appeared functional across textures. Following General aspiration precautions can help reduce risk of aspiration for all pts.  Pt observed w/ trials of solids and thin liquids. Pt fed self solids and caregiver offered assistance w/ liquids. During oral phase, pt exhibited adequate mastication, good bolus control/cohesion, and clear oral cavity post swallow. During pharyngeal phase, pt demonstrated seemingly timely pharyngeal swallow and clear vocal quality post-po's. Noted cough x1 w/ thin liquids w/ large sip from straw; cough was not consistent w/ sips of liquids nor observed w/ any solids. Otherwise, no overt s/s of aspiration such as coughing, throat clearing, nor wet vocal quality noted.  Caregiver reported history of pt having difficulty swallowing pills at baseline and that he highly prefers liquid meds which are being given currently per chart.  Caregiver reported pt's verbal communication wax and wane at times at baseline. Pt's communication is improving daily during this admit, and pt intermittently communicated w/ SLP throughout eval including several verbal words to communicate preferences and gestures to answer y/n questions. Pt's voice was clear and intelligible w/ verbal language. Pt's caregiver present, verbose, and often spoke for pt. Pt/caregiver educated on communication strategies including using yes/no questions, keeping language clear and concise (less verbage), and using visual aids for support as needed. IF pt needs any f/u regarding communication needs after acute event and d/c post-acute care, recommend following up w/ PCP at primary venue of care as needed.  Pt/caregiver agreed.  Recommend regular diet w/ thin liquids. Recommend liquid meds if possible -- if not possible, recommend meds crushed w/ puree. Follow General aspiration precautions at all po's. Recommend  intermittent supervision and assistance w/ tray set up.  No acute ST needs at this time. ST services will s/o. MD will reconsult if any new needs arise during admit. Pt/caregiver/RN updated and agreed.  SLP Visit Diagnosis: Dysphagia, unspecified (R13.10)    Aspiration Risk  No limitations    Diet Recommendation   Recommend regular diet w/ thin liquids.  Follow General aspiration precautions at all po's. Recommend intermittent supervision and assistance w/ tray set up.  Medication Administration: Crushed with puree (Prefers liquid meds!)    Other  Recommendations Oral Care Recommendations: Oral care before and after PO;Oral care BID    Recommendations for follow up therapy are one component of a multi-disciplinary discharge planning process, led by the attending physician.  Recommendations may be updated based on patient status, additional functional criteria and insurance authorization.  Follow up Recommendations Follow physician's recommendations for discharge plan and follow up therapies      Assistance Recommended at Discharge  Full  Functional Status Assessment Patient has had a recent decline in their functional status and demonstrates the ability to make significant improvements in function in a reasonable and predictable amount of time.  Frequency and Duration            Prognosis Prognosis for improved oropharyngeal function: Fair Barriers to Reach Goals: Severity of deficits      Swallow Study   General Date of Onset: 03/16/22 HPI: Per H&P, pt is a 27 y.o. 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  He was standing in start bumping into tables and walls then went to  the floor and what his friends describe the seizure lasted less than a minute.  When aroused he was not back to his normal self.  He has been compliant with his antiretroviral therapy per his family who reported no previous history of seizures.  No reported fever or chills.  No nausea or vomiting or abdominal pain.  No reported chest pain, palpitations, dyspnea or cough or wheezing. Type of Study: Bedside Swallow Evaluation Diet Prior to this Study: Regular;Thin liquids (Level 0) Temperature Spikes Noted: No (WBC 3.8) Respiratory Status: Room air History of Recent Intubation: No Behavior/Cognition: Alert;Cooperative;Pleasant mood Oral Cavity Assessment: Within Functional Limits Oral Care Completed by SLP: No (Recently completed by caregiver) Oral Cavity - Dentition: Adequate natural dentition Vision: Functional for self-feeding Self-Feeding Abilities: Able to feed self;Needs set up Patient Positioning: Upright in bed Baseline Vocal Quality: Normal Volitional Cough:  (NT) Volitional Swallow: Able to elicit    Oral/Motor/Sensory Function Overall Oral Motor/Sensory Function: Within functional limits   Ice Chips Ice chips: Not tested   Thin Liquid Thin Liquid: Within functional limits Presentation: Straw (~ 2-3 oz)    Nectar Thick Nectar Thick Liquid: Not tested   Honey Thick Honey Thick Liquid: Not tested   Puree Puree: Not tested   Solid     Solid: Within functional limits Presentation: Self Fed (~ 4-6 bites)     Randall Hiss Graduate Clinician [facility], Speech Pathology   Randall Hiss 03/17/2022,10:31  AM    

## 2023-03-16 ENCOUNTER — Emergency Department
Admission: EM | Admit: 2023-03-16 | Discharge: 2023-03-16 | Disposition: A | Discharge status: Home / Self Care | Payer: Medicare Other | Attending: Emergency Medicine | Admitting: Emergency Medicine

## 2023-03-16 ENCOUNTER — Other Ambulatory Visit: Payer: Self-pay

## 2023-03-16 DIAGNOSIS — H538 Other visual disturbances: Secondary | ICD-10-CM | POA: Diagnosis present

## 2023-03-16 LAB — CBC
HCT: 45.9 % (ref 39.0–52.0)
Hemoglobin: 15.7 g/dL (ref 13.0–17.0)
MCH: 31.4 pg (ref 26.0–34.0)
MCHC: 34.2 g/dL (ref 30.0–36.0)
MCV: 91.8 fL (ref 80.0–100.0)
Platelets: 228 10*3/uL (ref 150–400)
RBC: 5 MIL/uL (ref 4.22–5.81)
RDW: 12 % (ref 11.5–15.5)
WBC: 4.8 10*3/uL (ref 4.0–10.5)
nRBC: 0 % (ref 0.0–0.2)

## 2023-03-16 LAB — COMPREHENSIVE METABOLIC PANEL
ALT: 13 U/L (ref 0–44)
AST: 17 U/L (ref 15–41)
Albumin: 4.7 g/dL (ref 3.5–5.0)
Alkaline Phosphatase: 57 U/L (ref 38–126)
Anion gap: 10 (ref 5–15)
BUN: 15 mg/dL (ref 6–20)
CO2: 23 mmol/L (ref 22–32)
Calcium: 9.6 mg/dL (ref 8.9–10.3)
Chloride: 106 mmol/L (ref 98–111)
Creatinine, Ser: 0.92 mg/dL (ref 0.61–1.24)
GFR, Estimated: >60 mL/min (ref >60)
Glucose, Bld: 98 mg/dL (ref 70–99)
Potassium: 4 mmol/L (ref 3.5–5.1)
Sodium: 139 mmol/L (ref 135–145)
Total Bilirubin: 0.7 mg/dL (ref 0.0–1.2)
Total Protein: 7.9 g/dL (ref 6.5–8.1)

## 2023-03-16 NOTE — ED Triage Notes (Signed)
 Pt sts that he was instructed to come and be seen by an ER MD by his [facility] provier for possible neurosyphilis. Pt called and RN talked to pt social worker at [employer] that pt has been having cognitive and visual impairments and they want pt checked for neurosyphilis. Pt has a hx of Toxoplasmosis. Pt is a very poor historian.

## 2023-03-16 NOTE — Discharge Instructions (Signed)
Please follow up with your infectious disease doctor.

## 2023-03-16 NOTE — ED Provider Notes (Signed)
 [facility] Provider Note    Event Date/Time   First MD Initiated Contact with Patient 03/16/23 2015     (approximate)   History   Lumbar Puncture   HPI  Derrick Leonard is a 28 y.o. male   who states he was told to come to the emergency department today by his infectious disease doctor for a lumbar puncture.  However he does not know why they felt he needed a lumbar puncture.  He states that he has been having some blurry vision for about a week and a half.  Otherwise he denies any fevers or chills.  Denies any headaches.  He did attempt to call his ID doc while I was in the room to help obtain better history however no one answered.  States he gets his care at [facility] but came to this emergency department because he thought it would be faster.   I was able to access [facility]'s notes.  Per note dated 13 days ago he was recommended to go to the ED at that time because of concerns for neurosyphilis, however per chart review this has been a concern for roughly 2 and a half months and they had recommended LP at that time, patient was complaining of blurry vision at that time as well.  I did not see any obvious documentation that they had started any treatment for neurosyphilis since then.   Physical Exam   Triage Vital Signs: ED Triage Vitals  Encounter Vitals Group     BP 03/16/23 1400 133/85     Systolic BP Percentile --      Diastolic BP Percentile --      Pulse Rate 03/16/23 1400 (!) 59     Resp 03/16/23 1400 17     Temp 03/16/23 1400 98 F (36.7 C)     Temp Source 03/16/23 1400 Oral     SpO2 03/16/23 1400 98 %     Weight 03/16/23 1401 140 lb (63.5 kg)     Height 03/16/23 1401 5\' 9"  (1.753 m)     Head Circumference --      Peak Flow --      Pain Score 03/16/23 1401 9     Pain Loc --      Pain Education --      Exclude from Growth Chart --     Most recent vital signs: Vitals:   03/16/23 1400 03/16/23 1855  BP: 133/85 127/86  Pulse: (!) 59 (!) 55   Resp: 17 18  Temp: 98 F (36.7 C) 97.9 F (36.6 C)  SpO2: 98% 100%   General: Awake, alert CV:  Good peripheral perfusion.  Resp:  Normal effort.  Abd:  No distention.    ED Results / Procedures / Treatments   Labs (all labs ordered are listed, but only abnormal results are displayed) Labs Reviewed  CBC  COMPREHENSIVE METABOLIC PANEL     EKG  None   RADIOLOGY None  PROCEDURES:  Critical Care performed: No    MEDICATIONS ORDERED IN ED: Medications - No data to display   IMPRESSION / MDM / ASSESSMENT AND PLAN / ED COURSE  I reviewed the triage vital signs and the nursing notes.                              Differential diagnosis includes, but is not limited to, chronic neurologic disorder, syphillis  Patient's presentation is most consistent  with acute presentation with potential threat to life or bodily function.   Patient presented to the emergency department today for a lumbar puncture.  However per chart review this has been recommended for roughly 2-1/2 months and is unclear why he has not been scheduled for a lumbar puncture.  I discussed with our ID doctor on-call.  Does not think that any emergent LP has to be performed tonight.  Did not think patient needed to be admitted for medication to treat syphilis at this time given that it has been going on for months and it does not appear his own ID doctors felt that necessary. He did recommend that patient follow-up with his ID doctors who know him better and can plan further studies and management.     FINAL CLINICAL IMPRESSION(S) / ED DIAGNOSES   Final diagnoses:  Blurry vision      Note:  This document was prepared using Dragon voice recognition software and may include unintentional dictation errors.    Phineas Semen, MD 03/16/23 [phone]
